# Patient Record
Sex: Female | Born: 1985 | Race: White | Hispanic: No | Marital: Married | Smoking: Never smoker
Health system: Southern US, Community
[De-identification: ages and names within clinical notes are randomized; demographics above are authoritative.]

## PROBLEM LIST (undated history)

## (undated) ENCOUNTER — Inpatient Hospital Stay (HOSPITAL_COMMUNITY): Payer: Self-pay

## (undated) DIAGNOSIS — Z8619 Personal history of other infectious and parasitic diseases: Secondary | ICD-10-CM

## (undated) DIAGNOSIS — T6701XA Heatstroke and sunstroke, initial encounter: Secondary | ICD-10-CM

## (undated) HISTORY — DX: Personal history of other infectious and parasitic diseases: Z86.19

## (undated) HISTORY — PX: WISDOM TOOTH EXTRACTION: SHX21

---
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
## 2005-03-16 ENCOUNTER — Ambulatory Visit: Payer: Self-pay | Admitting: Family Medicine

## 2005-03-29 ENCOUNTER — Ambulatory Visit: Payer: Self-pay | Admitting: Family Medicine

## 2011-01-18 LAB — OB RESULTS CONSOLE GC/CHLAMYDIA: Chlamydia: NEGATIVE

## 2011-01-18 LAB — OB RESULTS CONSOLE RPR: RPR: NONREACTIVE

## 2011-01-18 LAB — OB RESULTS CONSOLE ANTIBODY SCREEN: Antibody Screen: NEGATIVE

## 2011-02-22 NOTE — L&D Delivery Note (Signed)
Operative Delivery Note At 2:19 PM a viable female was delivered via Vaginal, Vacuum Investment banker, operational).  Presentation: vertex; Position: Left,, Occiput,, Transverse; Station: +3.  Verbal consent: obtained from patient.  Risks and benefits discussed in detail.  Risks include, but are not limited to the risks of anesthesia, bleeding, infection, damage to maternal tissues, fetal cephalhematoma.  There is also the risk of inability to effect vaginal delivery of the head, or shoulder dystocia that cannot be resolved by established maneuvers, leading to the need for emergency cesarean section.  APGAR: 8, 9; weight .   Placenta status: Intact, Spontaneous.   Cord: 3 vessels with the following complications: Short.  Cord pH: not done  Anesthesia: Epidural Local  Instruments: Mushroom delivered head easily with 3 pulls and no popoffs Episiotomy: None Lacerations: Sulcus bilateral Suture Repair: 3.0 chromic Est. Blood Loss (mL): 500  Mom to postpartum.  Baby to nursery-stable.  Brynnan Rodenbaugh L 08/28/2011, 2:33 PM

## 2011-07-26 LAB — OB RESULTS CONSOLE GBS: GBS: NEGATIVE

## 2011-08-19 ENCOUNTER — Encounter (HOSPITAL_COMMUNITY): Payer: Self-pay | Admitting: *Deleted

## 2011-08-19 ENCOUNTER — Telehealth (HOSPITAL_COMMUNITY): Payer: Self-pay | Admitting: *Deleted

## 2011-08-19 NOTE — Telephone Encounter (Signed)
Preadmission screen  

## 2011-08-28 ENCOUNTER — Inpatient Hospital Stay (HOSPITAL_COMMUNITY)
Admission: AD | Admit: 2011-08-28 | Discharge: 2011-08-30 | DRG: 373 | Disposition: A | Discharge status: Home / Self Care | Payer: BC Managed Care – PPO | Source: Ambulatory Visit | Attending: Obstetrics and Gynecology | Admitting: Obstetrics and Gynecology

## 2011-08-28 ENCOUNTER — Inpatient Hospital Stay (HOSPITAL_COMMUNITY): Payer: BC Managed Care – PPO | Admitting: Anesthesiology

## 2011-08-28 ENCOUNTER — Encounter (HOSPITAL_COMMUNITY): Payer: Self-pay | Admitting: *Deleted

## 2011-08-28 ENCOUNTER — Inpatient Hospital Stay (HOSPITAL_COMMUNITY): Admission: RE | Admit: 2011-08-28 | Payer: Self-pay | Source: Ambulatory Visit

## 2011-08-28 ENCOUNTER — Encounter (HOSPITAL_COMMUNITY): Payer: Self-pay | Admitting: Anesthesiology

## 2011-08-28 HISTORY — DX: Heatstroke and sunstroke, initial encounter: T67.01XA

## 2011-08-28 LAB — CBC
MCH: 28.2 pg (ref 26.0–34.0)
Platelets: 208 10*3/uL (ref 150–400)
RBC: 4.5 MIL/uL (ref 3.87–5.11)
RDW: 13.4 % (ref 11.5–15.5)

## 2011-08-28 LAB — RPR: RPR Ser Ql: NONREACTIVE

## 2011-08-28 LAB — TYPE AND SCREEN: Antibody Screen: NEGATIVE

## 2011-08-28 LAB — ABO/RH: ABO/RH(D): O POS

## 2011-08-28 MED ORDER — OXYCODONE-ACETAMINOPHEN 5-325 MG PO TABS: 1.0000 | ORAL_TABLET | ORAL | Status: DC | PRN

## 2011-08-28 MED ORDER — DIPHENHYDRAMINE HCL 25 MG PO CAPS: 25.0000 mg | ORAL_CAPSULE | Freq: Four times a day (QID) | ORAL | Status: DC | PRN

## 2011-08-28 MED ORDER — EPHEDRINE 5 MG/ML INJ
10.0000 mg | INTRAVENOUS | Status: DC | PRN
  Filled 2011-08-28: qty 4

## 2011-08-28 MED ORDER — LANOLIN HYDROUS EX OINT: TOPICAL_OINTMENT | CUTANEOUS | Status: DC | PRN

## 2011-08-28 MED ORDER — MEASLES, MUMPS & RUBELLA VAC ~~LOC~~ INJ: 0.5000 mL | INJECTION | Freq: Once | SUBCUTANEOUS | Status: DC

## 2011-08-28 MED ORDER — LIDOCAINE HCL (PF) 1 % IJ SOLN
30.0000 mL | INTRAMUSCULAR | Status: DC | PRN
  Administered 2011-08-28: 30 mL via SUBCUTANEOUS
  Filled 2011-08-28: qty 30

## 2011-08-28 MED ORDER — ACETAMINOPHEN 325 MG PO TABS: 650.0000 mg | ORAL_TABLET | ORAL | Status: DC | PRN

## 2011-08-28 MED ORDER — LIDOCAINE HCL (PF) 1 % IJ SOLN
INTRAMUSCULAR | Status: DC | PRN
  Administered 2011-08-28 (×2): 5 mL

## 2011-08-28 MED ORDER — LACTATED RINGERS IV SOLN
500.0000 mL | Freq: Once | INTRAVENOUS | Status: AC
  Administered 2011-08-28: 1000 mL via INTRAVENOUS

## 2011-08-28 MED ORDER — TETANUS-DIPHTH-ACELL PERTUSSIS 5-2.5-18.5 LF-MCG/0.5 IM SUSP: 0.5000 mL | Freq: Once | INTRAMUSCULAR | Status: DC

## 2011-08-28 MED ORDER — PRENATAL MULTIVITAMIN CH
1.0000 | ORAL_TABLET | Freq: Every day | ORAL | Status: DC
  Administered 2011-08-28 – 2011-08-30 (×3): 1 via ORAL
  Filled 2011-08-28 (×3): qty 1

## 2011-08-28 MED ORDER — CITRIC ACID-SODIUM CITRATE 334-500 MG/5ML PO SOLN: 30.0000 mL | ORAL | Status: DC | PRN

## 2011-08-28 MED ORDER — LACTATED RINGERS IV SOLN
500.0000 mL | INTRAVENOUS | Status: DC | PRN
Start: 2011-08-28 — End: 2011-08-28

## 2011-08-28 MED ORDER — ONDANSETRON HCL 4 MG/2ML IJ SOLN
4.0000 mg | Freq: Four times a day (QID) | INTRAMUSCULAR | Status: DC | PRN
  Administered 2011-08-28: 4 mg via INTRAVENOUS
  Filled 2011-08-28: qty 2

## 2011-08-28 MED ORDER — PHENYLEPHRINE 40 MCG/ML (10ML) SYRINGE FOR IV PUSH (FOR BLOOD PRESSURE SUPPORT): 80.0000 ug | PREFILLED_SYRINGE | INTRAVENOUS | Status: DC | PRN

## 2011-08-28 MED ORDER — DIPHENHYDRAMINE HCL 50 MG/ML IJ SOLN: 12.5000 mg | INTRAMUSCULAR | Status: DC | PRN

## 2011-08-28 MED ORDER — OXYTOCIN 40 UNITS IN LACTATED RINGERS INFUSION - SIMPLE MED: 62.5000 mL/h | Freq: Once | INTRAVENOUS | Status: DC

## 2011-08-28 MED ORDER — EPHEDRINE 5 MG/ML INJ: 10.0000 mg | INTRAVENOUS | Status: DC | PRN

## 2011-08-28 MED ORDER — ONDANSETRON HCL 4 MG/2ML IJ SOLN: 4.0000 mg | INTRAMUSCULAR | Status: DC | PRN

## 2011-08-28 MED ORDER — IBUPROFEN 600 MG PO TABS
600.0000 mg | ORAL_TABLET | Freq: Four times a day (QID) | ORAL | Status: DC
  Administered 2011-08-28 – 2011-08-30 (×8): 600 mg via ORAL
  Filled 2011-08-28 (×7): qty 1

## 2011-08-28 MED ORDER — ONDANSETRON HCL 4 MG PO TABS: 4.0000 mg | ORAL_TABLET | ORAL | Status: DC | PRN

## 2011-08-28 MED ORDER — TERBUTALINE SULFATE 1 MG/ML IJ SOLN: 0.2500 mg | Freq: Once | INTRAMUSCULAR | Status: DC | PRN

## 2011-08-28 MED ORDER — OXYTOCIN BOLUS FROM INFUSION
250.0000 mL | Freq: Once | INTRAVENOUS | Status: DC
  Filled 2011-08-28: qty 500

## 2011-08-28 MED ORDER — FENTANYL 2.5 MCG/ML BUPIVACAINE 1/10 % EPIDURAL INFUSION (WH - ANES)
14.0000 mL/h | INTRAMUSCULAR | Status: DC
  Administered 2011-08-28 (×4): 14 mL/h via EPIDURAL
  Filled 2011-08-28 (×4): qty 60

## 2011-08-28 MED ORDER — FLEET ENEMA 7-19 GM/118ML RE ENEM: 1.0000 | ENEMA | Freq: Every day | RECTAL | Status: DC | PRN

## 2011-08-28 MED ORDER — SIMETHICONE 80 MG PO CHEW: 80.0000 mg | CHEWABLE_TABLET | ORAL | Status: DC | PRN

## 2011-08-28 MED ORDER — WITCH HAZEL-GLYCERIN EX PADS: 1.0000 "application " | MEDICATED_PAD | CUTANEOUS | Status: DC | PRN

## 2011-08-28 MED ORDER — SENNOSIDES-DOCUSATE SODIUM 8.6-50 MG PO TABS
2.0000 | ORAL_TABLET | Freq: Every day | ORAL | Status: DC
  Administered 2011-08-29 (×2): 2 via ORAL

## 2011-08-28 MED ORDER — BISACODYL 10 MG RE SUPP: 10.0000 mg | Freq: Every day | RECTAL | Status: DC | PRN

## 2011-08-28 MED ORDER — OXYTOCIN 40 UNITS IN LACTATED RINGERS INFUSION - SIMPLE MED
1.0000 m[IU]/min | INTRAVENOUS | Status: DC
  Administered 2011-08-28: 2 m[IU]/min via INTRAVENOUS
  Filled 2011-08-28: qty 1000

## 2011-08-28 MED ORDER — LACTATED RINGERS IV SOLN
INTRAVENOUS | Status: DC
  Administered 2011-08-28 (×2): via INTRAVENOUS

## 2011-08-28 MED ORDER — DIBUCAINE 1 % RE OINT: 1.0000 "application " | TOPICAL_OINTMENT | RECTAL | Status: DC | PRN

## 2011-08-28 MED ORDER — FLEET ENEMA 7-19 GM/118ML RE ENEM: 1.0000 | ENEMA | RECTAL | Status: DC | PRN

## 2011-08-28 MED ORDER — ZOLPIDEM TARTRATE 5 MG PO TABS: 5.0000 mg | ORAL_TABLET | Freq: Every evening | ORAL | Status: DC | PRN

## 2011-08-28 MED ORDER — BENZOCAINE-MENTHOL 20-0.5 % EX AERO
1.0000 "application " | INHALATION_SPRAY | CUTANEOUS | Status: DC | PRN
  Administered 2011-08-29: 1 via TOPICAL
  Filled 2011-08-28: qty 56

## 2011-08-28 MED ORDER — PHENYLEPHRINE 40 MCG/ML (10ML) SYRINGE FOR IV PUSH (FOR BLOOD PRESSURE SUPPORT)
80.0000 ug | PREFILLED_SYRINGE | INTRAVENOUS | Status: DC | PRN
  Filled 2011-08-28: qty 5

## 2011-08-28 MED ORDER — IBUPROFEN 600 MG PO TABS: 600.0000 mg | ORAL_TABLET | Freq: Four times a day (QID) | ORAL | Status: DC | PRN

## 2011-08-28 MED ORDER — MEDROXYPROGESTERONE ACETATE 150 MG/ML IM SUSP: 150.0000 mg | INTRAMUSCULAR | Status: DC | PRN

## 2011-08-28 NOTE — H&P (Signed)
26 year old G 1 P 0 at 40 w 4 days presents with SROM this am clear fluid. She was 2 cm dilated on admission. She is now comfortable status post epidural.  PNC see [place] GBBS is negative  Afebrile Vital signs stable  General alert and oriented Lung CTAB Car RRR Abdomen is soft and non tender  Cervix is 90%/5 cm -1 Vertex  IMPRESSION: LABOR SROM  PLAN: Pitocin for augmentation Follow labor curve

## 2011-08-28 NOTE — Progress Notes (Signed)
Pt sleepy but discussing breastfeeding w/nursery RN. Given tea and Sprite to drink. VS stable.

## 2011-08-28 NOTE — Anesthesia Preprocedure Evaluation (Signed)

## 2011-08-28 NOTE — Progress Notes (Signed)
Patient feeling more pressure. Fetal heart rate is reactive Toco Great contraction pattern Cervix is C/C +1 Some caput  IMPRESSION: IUP at term Labor  PLAN: Start pushing

## 2011-08-28 NOTE — Anesthesia Procedure Notes (Signed)
Epidural Patient location during procedure: OB Start time: 08/28/2011 6:41 AM  Staffing Anesthesiologist: Brayton Caves R Performed by: anesthesiologist   Preanesthetic Checklist Completed: patient identified, site marked, surgical consent, pre-op evaluation, timeout performed, IV checked, risks and benefits discussed and monitors and equipment checked  Epidural Patient position: sitting Prep: site prepped and draped and DuraPrep Patient monitoring: continuous pulse ox and blood pressure Approach: midline Injection technique: LOR air and LOR saline  Needle:  Needle type: Tuohy  Needle gauge: 17 G Needle length: 9 cm Needle insertion depth: 6 cm Catheter type: closed end flexible Catheter size: 19 Gauge Catheter at skin depth: 11 cm Test dose: negative  Assessment Events: blood not aspirated, injection not painful, no injection resistance, negative IV test and no paresthesia  Additional Notes Patient identified.  Risk benefits discussed including failed block, incomplete pain control, headache, nerve damage, paralysis, blood pressure changes, nausea, vomiting, reactions to medication both toxic or allergic, and postpartum back pain.  Patient expressed understanding and wished to proceed.  All questions were answered.  Sterile technique used throughout procedure and epidural site dressed with sterile barrier dressing. No paresthesia or other complications noted.The patient did not experience any signs of intravascular injection such as tinnitus or metallic taste in mouth nor signs of intrathecal spread such as rapid motor block. Please see nursing notes for vital signs.

## 2011-08-28 NOTE — MAU Note (Signed)
Pt states, " I was in bed, and I felt a big gush of water  At 1 am,; it was about 2 cups, and if move, I keep felling more run out."

## 2011-08-28 NOTE — Progress Notes (Signed)
At 1620 Pt up in [place], c/o feeling nausea and weakness, became pale and limp. Assisted to bed and pivoted legs to bed. Laid to left side. Evaluating BP. Pt talking, sleepy as she has been all day. Requesting tea to drink.

## 2011-08-29 LAB — CBC
Hemoglobin: 9.8 g/dL — ABNORMAL LOW (ref 12.0–15.0)
MCH: 28.3 pg (ref 26.0–34.0)
RBC: 3.46 MIL/uL — ABNORMAL LOW (ref 3.87–5.11)
WBC: 18 10*3/uL — ABNORMAL HIGH (ref 4.0–10.5)

## 2011-08-29 NOTE — Progress Notes (Signed)
Ur chart review completed.  

## 2011-08-29 NOTE — Progress Notes (Signed)
Post Partum Day one Subjective: no complaints  Objective: Blood pressure 115/77, pulse 90, temperature 98.1 F (36.7 C), temperature source Oral, resp. rate 18, height 5' 6.5" (1.689 m), weight 89.982 kg (198 lb 6 oz), last menstrual period 11/17/2010, SpO2 97.00%, unknown if currently breastfeeding.  Physical Exam:  General: alert Lochia: appropriate Uterine Fundus: firm Incision: na DVT Evaluation: No evidence of DVT seen on physical exam.   Basename 08/29/11 0525 08/28/11 0353  HGB 9.8* 12.7  HCT 29.7* 38.3    Assessment/Plan: Plan for discharge tomorrow   LOS: 1 day   Carollee Nussbaumer S 08/29/2011, 8:17 AM

## 2011-08-29 NOTE — Anesthesia Postprocedure Evaluation (Signed)
  Anesthesia Post-op Note  Patient: Grace Mcdaniel  Procedure(s) Performed: * No surgery found *  Patient Location: Mother/Baby  Anesthesia Type: Epidural  Level of Consciousness: awake  Airway and Oxygen Therapy: Patient Spontanous Breathing  Post-op Pain: none  Post-op Assessment: Patient's Cardiovascular Status Stable, Respiratory Function Stable, Patent Airway, No signs of Nausea or vomiting, Adequate PO intake, Pain level controlled, No headache, No backache, No residual numbness and No residual motor weakness  Post-op Vital Signs: Reviewed and stable  Complications: No apparent anesthesia complications

## 2011-08-30 MED ORDER — IBUPROFEN 600 MG PO TABS: 600.0000 mg | ORAL_TABLET | Freq: Four times a day (QID) | ORAL | Status: AC

## 2011-08-30 NOTE — Discharge Summary (Signed)
Obstetric Discharge Summary Reason for Admission: rupture of membranes Prenatal Procedures: ultrasound Intrapartum Procedures: vacuum Postpartum Procedures: none Complications-Operative and Postpartum: bilateral sulcus tear with repair Hemoglobin  Date Value Range Status  08/29/2011 9.8* 12.0 - 15.0 g/dL Final     REPEATED TO VERIFY     DELTA CHECK NOTED     HCT  Date Value Range Status  08/29/2011 29.7* 36.0 - 46.0 % Final    Physical Exam:  General: alert and cooperative Lochia: appropriate Uterine Fundus: firm Incision: perineum intact DVT Evaluation: No evidence of DVT seen on physical exam.  Discharge Diagnoses: Term Pregnancy-delivered  Discharge Information: Date: 08/30/2011 Activity: pelvic rest Diet: routine Medications: PNV and Ibuprofen Condition: stable Instructions: refer to practice specific booklet Discharge to: home   Newborn Data: Live born female  Birth Weight: 9 lb 7.7 oz (4300 g) APGAR: 8, 9  Home with mother.  Grace Mcdaniel G 08/30/2011, 8:08 AM

## 2011-09-02 ENCOUNTER — Ambulatory Visit (HOSPITAL_COMMUNITY): Payer: BC Managed Care – PPO

## 2013-02-19 LAB — OB RESULTS CONSOLE GC/CHLAMYDIA
Chlamydia: NEGATIVE
GC PROBE AMP, GENITAL: NEGATIVE

## 2013-02-19 LAB — OB RESULTS CONSOLE HIV ANTIBODY (ROUTINE TESTING)
HIV: NONREACTIVE
HIV: NONREACTIVE

## 2013-02-19 LAB — OB RESULTS CONSOLE RPR
RPR: NONREACTIVE
RPR: NONREACTIVE

## 2013-02-19 LAB — OB RESULTS CONSOLE ABO/RH: RH Type: POSITIVE

## 2013-02-19 LAB — OB RESULTS CONSOLE HEPATITIS B SURFACE ANTIGEN: Hepatitis B Surface Ag: NEGATIVE

## 2013-02-19 LAB — OB RESULTS CONSOLE RUBELLA ANTIBODY, IGM: Rubella: IMMUNE

## 2013-02-19 LAB — OB RESULTS CONSOLE ANTIBODY SCREEN: Antibody Screen: NEGATIVE

## 2013-02-21 NOTE — L&D Delivery Note (Signed)
Delivery Note At 8:20 AM a viable female was delivered via Vaginal, Spontaneous Delivery (Presentation:OA  ;  ).  APGAR:9/9 , ; weight .   Placenta status:spont>>intact , .  Cord:  with the following complications: .  Cord pH: not sent  Anesthesia: Epidural  Episiotomy: none Lacerations: first deg Suture Repair: 3.0 vicryl rapide Est. Blood Loss (mL): 300  Mom to postpartum.  Baby to Couplet care / Skin to Skin.  Grace Mcdaniel 09/22/2013, 8:30 AM

## 2013-04-01 ENCOUNTER — Encounter (HOSPITAL_COMMUNITY): Payer: Self-pay | Admitting: *Deleted

## 2013-04-01 ENCOUNTER — Observation Stay (HOSPITAL_COMMUNITY)
Admission: AD | Admit: 2013-04-01 | Discharge: 2013-04-02 | Disposition: A | Discharge status: Home / Self Care | Payer: BC Managed Care – PPO | Source: Ambulatory Visit | Attending: Obstetrics and Gynecology | Admitting: Obstetrics and Gynecology

## 2013-04-01 DIAGNOSIS — O200 Threatened abortion: Principal | ICD-10-CM | POA: Diagnosis present

## 2013-04-01 DIAGNOSIS — R109 Unspecified abdominal pain: Secondary | ICD-10-CM | POA: Insufficient documentation

## 2013-04-01 LAB — CBC
HCT: 35.8 % — ABNORMAL LOW (ref 36.0–46.0)
Hemoglobin: 12.3 g/dL (ref 12.0–15.0)
MCH: 28.4 pg (ref 26.0–34.0)
MCHC: 34.4 g/dL (ref 30.0–36.0)
MCV: 82.7 fL (ref 78.0–100.0)
PLATELETS: 316 10*3/uL (ref 150–400)
RBC: 4.33 MIL/uL (ref 3.87–5.11)
RDW: 13.1 % (ref 11.5–15.5)
WBC: 12.7 10*3/uL — AB (ref 4.0–10.5)

## 2013-04-01 LAB — TYPE AND SCREEN
ABO/RH(D): O POS
ANTIBODY SCREEN: NEGATIVE

## 2013-04-01 MED ORDER — ZOLPIDEM TARTRATE 5 MG PO TABS
5.0000 mg | ORAL_TABLET | Freq: Every evening | ORAL | Status: DC | PRN
  Administered 2013-04-01: 5 mg via ORAL
  Filled 2013-04-01: qty 1

## 2013-04-01 MED ORDER — LACTATED RINGERS IV SOLN
INTRAVENOUS | Status: DC
  Administered 2013-04-01: 75 mL/h via INTRAVENOUS

## 2013-04-01 MED ORDER — DOCUSATE SODIUM 100 MG PO CAPS
100.0000 mg | ORAL_CAPSULE | Freq: Every day | ORAL | Status: DC
Start: 2013-04-01 — End: 2013-04-02
  Administered 2013-04-01: 100 mg via ORAL
  Filled 2013-04-01: qty 1

## 2013-04-01 MED ORDER — CALCIUM CARBONATE ANTACID 500 MG PO CHEW: 2.0000 | CHEWABLE_TABLET | ORAL | Status: DC | PRN

## 2013-04-01 MED ORDER — ACETAMINOPHEN 325 MG PO TABS: 650.0000 mg | ORAL_TABLET | ORAL | Status: DC | PRN

## 2013-04-01 MED ORDER — PRENATAL MULTIVITAMIN CH
1.0000 | ORAL_TABLET | Freq: Every day | ORAL | Status: DC
  Administered 2013-04-01: 1 via ORAL
  Filled 2013-04-01: qty 1

## 2013-04-01 NOTE — H&P (Signed)
Grace Mcdaniel is a 28 y.o. female @ 14+4 wks presenting for heavy VB.  Pt awoke this am and noted bright red VB.  Bleeding a/w mild cramping.  Feels some lightheadedness but a/w not eating breakfast yet.  No n/v.  History OB History   Grav Para Term Preterm Abortions TAB SAB Ect Mult Living   3 1 1  1  1   1      Past Medical History  Diagnosis Date  . H/O varicella   . Heat stroke    Past Surgical History  Procedure Laterality Date  . Wisdom tooth extraction     Family History: family history includes Cancer in her maternal grandmother and mother; Hypertension in her father; Kidney disease in her father. Social History:  reports that she has never smoked. She has never used smokeless tobacco. She reports that she does not drink alcohol or use illicit drugs.   Prenatal Transfer Tool  Maternal Diabetes: No Genetic Screening: Declined Maternal Ultrasounds/Referrals: Normal Fetal Ultrasounds or other Referrals:  None Maternal Substance Abuse:  No Significant Maternal Medications:  None Significant Maternal Lab Results:  None Other Comments:  None  ROS    Blood pressure 109/66, pulse 99, temperature 98.5 F (36.9 C), temperature source Oral, resp. rate 20, height 5\' 8"  (1.727 m), weight 79.833 kg (176 lb), unknown if currently breastfeeding. Exam Physical Exam  Gen - NAD Abd - soft, NT/ND PV - pt with modeate blood noted, cvx closed.    US - 8x2cm clot noted posterior above cervical os.  + FHT.  Anterior placenta  cvx 3.9cm, no funneling Prenatal labs: ABO, Rh:O+   Antibody:   Rubella:   RPR:    HBsAg:    HIV:    GBS:     Assessment/Plan: Threatened Miscarriage - pt uneasy about amount of bleeding she is having Plan obs overnght.  Check CBC and rpt US in am    Grace Mcdaniel 04/01/2013, 3:30 PM

## 2013-04-01 NOTE — Progress Notes (Signed)
Patient's urine in the hat is very bloody and she states she "feels like she is peeing on herself when she stands and it is blood."

## 2013-04-01 NOTE — Progress Notes (Signed)
Pt feeling a bit better.  VB has persisted but decreased somewhat.  No pain.  + FHT by doppler Hgb stable at 12  A/P:  Threatened Ab, 14+ wks Rpt us and cbc in am.   D/c home if bleeding is stable. Plan of care reviewed with pt and husband

## 2013-04-02 ENCOUNTER — Ambulatory Visit (HOSPITAL_COMMUNITY): Payer: BC Managed Care – PPO

## 2013-04-02 LAB — CBC
HEMATOCRIT: 34.6 % — AB (ref 36.0–46.0)
Hemoglobin: 11.5 g/dL — ABNORMAL LOW (ref 12.0–15.0)
MCH: 27.8 pg (ref 26.0–34.0)
MCHC: 33.2 g/dL (ref 30.0–36.0)
MCV: 83.6 fL (ref 78.0–100.0)
Platelets: 294 10*3/uL (ref 150–400)
RBC: 4.14 MIL/uL (ref 3.87–5.11)
RDW: 13.3 % (ref 11.5–15.5)
WBC: 10.3 10*3/uL (ref 4.0–10.5)

## 2013-04-02 NOTE — Progress Notes (Signed)
Pt having small amt of bleeding with penicil eraser size clot on voiding.

## 2013-04-02 NOTE — Progress Notes (Signed)
No complaints this AM.  VB has slowed.  Had same pad on overnight.  No pain since yesterday.    VSS.  AF.  Hgb 12.3->11.5 Blood type O pos  U/S this AM: (report pending) FHT 149.  Clot stable in size.  Gen: A&O x 3 Abd: soft, NT Ext: no c/c/e  27yo G3P1011 at 6879w5d with threatened ab -Bleeding precautions given -F/U scheduled with Dr. Renaldo FiddlerAdkins in 1 week -Discharge home with bedrest until f/u

## 2013-04-02 NOTE — Progress Notes (Signed)
Discharge instructions reviewed with patient. Verbalized an understanding of instructions. Discharged via wheelchair. 

## 2013-04-02 NOTE — Discharge Summary (Signed)
Obstetric Discharge Summary Reason for Admission: observation/evaluation and threatened abortion Prenatal Procedures: ultrasound Intrapartum Procedures: n/a Postpartum Procedures: none Complications-Operative and Postpartum: none Hemoglobin  Date Value Range Status  04/02/2013 11.5* 12.0 - 15.0 g/dL Final     HCT  Date Value Range Status  04/02/2013 34.6* 36.0 - 46.0 % Final    Physical Exam:  General: alert, cooperative and appears stated age 59Lochia: n/a Uterine Fundus: soft Incision: n/a DVT Evaluation: No evidence of DVT seen on physical exam. Negative Homan's sign. No cords or calf tenderness.  Discharge Diagnoses: threatened abortion  Discharge Information: Date: 04/02/2013 Activity: pelvic rest and bed rest Diet: routine Medications: PNV Condition: stable Instructions: refer to practice specific booklet Discharge to: home   Newborn Data: <<This patient has not yet delivered during this pregnancy.>> Home with n/a.  Grace Mcdaniel 04/02/2013, 9:09 AM

## 2013-04-02 NOTE — Discharge Instructions (Signed)
Call MD for T>100.4, severe abdominal pain, heavy vaginal bleeding.  Follow-up in office at appointment in 1 week.  Pelvic rest and bed rest until appointment.

## 2013-08-26 LAB — OB RESULTS CONSOLE GBS: STREP GROUP B AG: NEGATIVE

## 2013-08-31 ENCOUNTER — Inpatient Hospital Stay (HOSPITAL_COMMUNITY)
Admission: AD | Admit: 2013-08-31 | Discharge: 2013-08-31 | Disposition: A | Discharge status: Home / Self Care | Payer: BC Managed Care – PPO | Source: Ambulatory Visit | Attending: Obstetrics and Gynecology | Admitting: Obstetrics and Gynecology

## 2013-08-31 ENCOUNTER — Encounter (HOSPITAL_COMMUNITY): Payer: Self-pay | Admitting: *Deleted

## 2013-08-31 DIAGNOSIS — K644 Residual hemorrhoidal skin tags: Secondary | ICD-10-CM | POA: Insufficient documentation

## 2013-08-31 DIAGNOSIS — K6289 Other specified diseases of anus and rectum: Secondary | ICD-10-CM | POA: Insufficient documentation

## 2013-08-31 DIAGNOSIS — O228X9 Other venous complications in pregnancy, unspecified trimester: Secondary | ICD-10-CM | POA: Insufficient documentation

## 2013-08-31 MED ORDER — LIDOCAINE 5 % EX OINT: 1.0000 "application " | TOPICAL_OINTMENT | CUTANEOUS | Status: DC | PRN

## 2013-08-31 MED ORDER — LIDOCAINE-HYDROCORTISONE ACE 2-2 % RE KIT: 1.0000 "application " | PACK | Freq: Two times a day (BID) | RECTAL | Status: DC

## 2013-08-31 NOTE — MAU Note (Signed)
Assumed care of patient.

## 2013-08-31 NOTE — MAU Provider Note (Signed)
History     CSN: [id number]  Arrival date and time: 08/31/13 1003   First Provider Initiated Contact with Patient 08/31/13 1111      No chief complaint on file.  HPI Ms. Grace Mcdaniel is a 28 y.o. G3P1011 at 5664w2d who presents to MAU today with complaint of rectal pain and pressure with known external hemorrhoids. She states that the pain has been worse since Thursday. She had her last BM this morning and noted scant blood on the tissue and noted tarry stools. She denies abdominal pain, but is having mild braxton-hicks contractions occasionally. She is currently taking Colace, last dose was this morning and using topical preparation H. She denies vaginal bleeding or LOF and reports good fetal movement.   OB History   Grav Para Term Preterm Abortions TAB SAB Ect Mult Living   3 1 1  1  1   1       Past Medical History  Diagnosis Date  . H/O varicella   . Heat stroke     Past Surgical History  Procedure Laterality Date  . Wisdom tooth extraction      Family History  Problem Relation Age of Onset  . Cancer Mother     breast  . Hypertension Father   . Kidney disease Father     stones  . Cancer Maternal Grandmother     breast    History  Substance Use Topics  . Smoking status: Never Smoker   . Smokeless tobacco: Never Used  . Alcohol Use: No    Allergies: No Known Allergies  Prescriptions prior to admission  Medication Sig Dispense Refill  . docusate sodium (COLACE) 100 MG capsule Take 100 mg by mouth as needed for mild constipation.      . Prenatal Vit-Fe Fumarate-FA (PRENATAL MULTIVITAMIN) TABS Take 1 tablet by mouth daily.        Review of Systems  Constitutional: Negative for fever and malaise/fatigue.  Gastrointestinal: Positive for nausea, constipation and melena. Negative for vomiting, abdominal pain, diarrhea and blood in stool.  Genitourinary: Negative for dysuria, urgency and frequency.       Neg - vaginal bleeding, discharge, LOF   Physical  Exam   Blood pressure 123/69, pulse 107, temperature 98.5 F (36.9 C), temperature source Oral, resp. rate 16, height 5\' 8"  (1.727 m), weight 202 lb (91.627 kg), unknown if currently breastfeeding.  Physical Exam  Constitutional: She is oriented to person, place, and time. She appears well-developed and well-nourished. No distress.  HENT:  Head: Normocephalic and atraumatic.  Cardiovascular: Normal rate, regular rhythm and normal heart sounds.   Respiratory: Effort normal and breath sounds normal. No respiratory distress.  GI: Soft. Bowel sounds are normal. She exhibits no distension and no mass. There is no tenderness. There is no rebound and no guarding.  Genitourinary: Rectal exam shows external hemorrhoid (large external hemorrhoids noted without active bleeding) and tenderness.  Neurological: She is alert and oriented to person, place, and time.  Skin: Skin is warm and dry. No erythema.  Psychiatric: She has a normal mood and affect.  Dilation: Closed Effacement (%): Thick Cervical Position: Posterior Station:  (high) Exam by:: Dellie BurnsScarlett Murray, RN BSN  No results found for this or any previous visit (from the past 24 hour(s)).  Fetal Monitoring: Baseline: 120 bpm, moderate variability, + accelerations, no decelerations Contractions: irregular, mild q 2-5 minutes  MAU Course  Procedures None  MDM Discussed with Dr. Renaldo FiddlerAdkins. Rx for Ana-lex and topical lidocaine. Continue  bowel regimen. Follow-up on Monday.   Assessment and Plan  A: External hemorrhoids  P: Discharge home Rx for Ana-lex and topical lidocaine given to patient Patient advised to continue bowel regimen  Patient advised to call the office for a follow-up appointment on Monday for re-assessment of hemorrhoid. May require surgical consult.  Patient may return to MAU as needed or if her condition were to change or worsen  Freddi Starr, PA-C  08/31/2013, 11:21 AM

## 2013-08-31 NOTE — Discharge Instructions (Signed)
Fiber Content in Foods Drinking plenty of fluids and consuming foods high in fiber can help with constipation. See the list below for the fiber content of some common foods. Starches and Grains / Dietary Fiber (g)  Cheerios, 1 cup / 3 g  Kellogg's Corn Flakes, 1 cup / 0.7 g  Rice Krispies, 1  cup / 0.3 g  Quaker Oat Life Cereal,  cup / 2.1 g  Oatmeal, instant (cooked),  cup / 2 g  Kellogg's Frosted Mini Wheats, 1 cup / 5.1 g  Rice, brown, long-grain (cooked), 1 cup / 3.5 g  Rice, white, long-grain (cooked), 1 cup / 0.6 g  Macaroni, cooked, enriched, 1 cup / 2.5 g Legumes / Dietary Fiber (g)  Beans, baked, canned, plain or vegetarian,  cup / 5.2 g  Beans, kidney, canned,  cup / 6.8 g  Beans, pinto, dried (cooked),  cup / 7.7 g  Beans, pinto, canned,  cup / 5.5 g Breads and Crackers / Dietary Fiber (g)  Graham crackers, plain or honey, 2 squares / 0.7 g  Saltine crackers, 3 squares / 0.3 g  Pretzels, plain, salted, 10 pieces / 1.8 g  Bread, whole-wheat, 1 slice / 1.9 g  Bread, white, 1 slice / 0.7 g  Bread, raisin, 1 slice / 1.2 g  Bagel, plain, 3 oz / 2 g  Tortilla, flour, 1 oz / 0.9 g  Tortilla, corn, 1 small / 1.5 g  Bun, hamburger or hotdog, 1 small / 0.9 g Fruits / Dietary Fiber (g)  Apple, raw with skin, 1 medium / 4.4 g  Applesauce, sweetened,  cup / 1.5 g  Banana,  medium / 1.5 g  Grapes, 10 grapes / 0.4 g  Orange, 1 small / 2.3 g  Raisin, 1.5 oz / 1.6 g  Melon, 1 cup / 1.4 g Vegetables / Dietary Fiber (g)  Green beans, canned,  cup / 1.3 g  Carrots (cooked),  cup / 2.3 g  Broccoli (cooked),  cup / 2.8 g  Peas, frozen (cooked),  cup / 4.4 g  Potatoes, mashed,  cup / 1.6 g  Lettuce, 1 cup / 0.5 g  Corn, canned,  cup / 1.6 g  Tomato,  cup / 1.1 g Document Released: 06/26/2006 Document Revised: 05/02/2011 Document Reviewed: 08/21/2006 [employer] Patient Information 2015 [employer]. This information is not  intended to replace advice given to you by your health care provider. Make sure you discuss any questions you have with your health care provider. Hemorrhoids Hemorrhoids are swollen veins around the rectum or anus. There are two types of hemorrhoids:   Internal hemorrhoids. These occur in the veins just inside the rectum. They may poke through to the outside and become irritated and painful.  External hemorrhoids. These occur in the veins outside the anus and can be felt as a painful swelling or hard lump near the anus. CAUSES  Pregnancy.   Obesity.   Constipation or diarrhea.   Straining to have a bowel movement.   Sitting for long periods on the toilet.  Heavy lifting or other activity that caused you to strain.  Anal intercourse. SYMPTOMS   Pain.   Anal itching or irritation.   Rectal bleeding.   Fecal leakage.   Anal swelling.   One or more lumps around the anus.  DIAGNOSIS  Your caregiver may be able to diagnose hemorrhoids by visual examination. Other examinations or tests that may be performed include:   Examination of the rectal area with  a gloved hand (digital rectal exam).   Examination of anal canal using a small tube (scope).   A blood test if you have lost a significant amount of blood.  A test to look inside the colon (sigmoidoscopy or colonoscopy). TREATMENT Most hemorrhoids can be treated at home. However, if symptoms do not seem to be getting better or if you have a lot of rectal bleeding, your caregiver may perform a procedure to help make the hemorrhoids get smaller or remove them completely. Possible treatments include:   Placing a rubber band at the base of the hemorrhoid to cut off the circulation (rubber band ligation).   Injecting a chemical to shrink the hemorrhoid (sclerotherapy).   Using a tool to burn the hemorrhoid (infrared light therapy).   Surgically removing the hemorrhoid (hemorrhoidectomy).   Stapling the  hemorrhoid to block blood flow to the tissue (hemorrhoid stapling).  HOME CARE INSTRUCTIONS   Eat foods with fiber, such as whole grains, beans, nuts, fruits, and vegetables. Ask your doctor about taking products with added fiber in them (fibersupplements).  Increase fluid intake. Drink enough water and fluids to keep your urine clear or pale yellow.   Exercise regularly.   Go to the bathroom when you have the urge to have a bowel movement. Do not wait.   Avoid straining to have bowel movements.   Keep the anal area dry and clean. Use wet toilet paper or moist towelettes after a bowel movement.   Medicated creams and suppositories may be used or applied as directed.   Only take over-the-counter or prescription medicines as directed by your caregiver.   Take warm sitz baths for 15-20 minutes, 3-4 times a day to ease pain and discomfort.   Place ice packs on the hemorrhoids if they are tender and swollen. Using ice packs between sitz baths may be helpful.   Put ice in a plastic bag.   Place a towel between your skin and the bag.   Leave the ice on for 15-20 minutes, 3-4 times a day.   Do not use a donut-shaped pillow or sit on the toilet for long periods. This increases blood pooling and pain.  SEEK MEDICAL CARE IF:  You have increasing pain and swelling that is not controlled by treatment or medicine.  You have uncontrolled bleeding.  You have difficulty or you are unable to have a bowel movement.  You have pain or inflammation outside the area of the hemorrhoids. MAKE SURE YOU:  Understand these instructions.  Will watch your condition.  Will get help right away if you are not doing well or get worse. Document Released: 02/05/2000 Document Revised: 01/25/2012 Document Reviewed: 12/13/2011 [facility] Patient Information 2015 [place], [place]. This information is not intended to replace advice given to you by your health care provider. Make sure you discuss  any questions you have with your health care provider.

## 2013-08-31 NOTE — MAU Note (Signed)
Patient presents with complaint of rectal pressure.

## 2013-09-22 ENCOUNTER — Inpatient Hospital Stay (HOSPITAL_COMMUNITY): Payer: BC Managed Care – PPO | Admitting: Anesthesiology

## 2013-09-22 ENCOUNTER — Inpatient Hospital Stay (HOSPITAL_COMMUNITY)
Admission: AD | Admit: 2013-09-22 | Discharge: 2013-09-23 | DRG: 775 | Disposition: A | Discharge status: Home / Self Care | Payer: BC Managed Care – PPO | Source: Ambulatory Visit | Attending: Obstetrics and Gynecology | Admitting: Obstetrics and Gynecology

## 2013-09-22 ENCOUNTER — Encounter (HOSPITAL_COMMUNITY): Payer: Self-pay

## 2013-09-22 ENCOUNTER — Encounter (HOSPITAL_COMMUNITY): Payer: BC Managed Care – PPO | Admitting: Anesthesiology

## 2013-09-22 DIAGNOSIS — Z8249 Family history of ischemic heart disease and other diseases of the circulatory system: Secondary | ICD-10-CM

## 2013-09-22 LAB — CBC
HEMATOCRIT: 39.4 % (ref 36.0–46.0)
HEMOGLOBIN: 13.1 g/dL (ref 12.0–15.0)
MCH: 28.5 pg (ref 26.0–34.0)
MCHC: 33.2 g/dL (ref 30.0–36.0)
MCV: 85.8 fL (ref 78.0–100.0)
Platelets: 210 10*3/uL (ref 150–400)
RBC: 4.59 MIL/uL (ref 3.87–5.11)
RDW: 14 % (ref 11.5–15.5)
WBC: 11 10*3/uL — ABNORMAL HIGH (ref 4.0–10.5)

## 2013-09-22 LAB — RPR

## 2013-09-22 MED ORDER — LACTATED RINGERS IV SOLN: 500.0000 mL | Freq: Once | INTRAVENOUS | Status: DC

## 2013-09-22 MED ORDER — CITRIC ACID-SODIUM CITRATE 334-500 MG/5ML PO SOLN: 30.0000 mL | ORAL | Status: DC | PRN

## 2013-09-22 MED ORDER — OXYCODONE-ACETAMINOPHEN 5-325 MG PO TABS
1.0000 | ORAL_TABLET | Freq: Four times a day (QID) | ORAL | Status: DC | PRN
  Filled 2013-09-22: qty 1

## 2013-09-22 MED ORDER — IBUPROFEN 800 MG PO TABS
800.0000 mg | ORAL_TABLET | Freq: Three times a day (TID) | ORAL | Status: DC | PRN
  Administered 2013-09-22 – 2013-09-23 (×3): 800 mg via ORAL
  Filled 2013-09-22 (×3): qty 1

## 2013-09-22 MED ORDER — ONDANSETRON HCL 4 MG PO TABS: 4.0000 mg | ORAL_TABLET | ORAL | Status: DC | PRN

## 2013-09-22 MED ORDER — TETANUS-DIPHTH-ACELL PERTUSSIS 5-2.5-18.5 LF-MCG/0.5 IM SUSP: 0.5000 mL | Freq: Once | INTRAMUSCULAR | Status: DC

## 2013-09-22 MED ORDER — SENNOSIDES-DOCUSATE SODIUM 8.6-50 MG PO TABS
2.0000 | ORAL_TABLET | ORAL | Status: DC
  Administered 2013-09-23: 2 via ORAL
  Filled 2013-09-22: qty 2

## 2013-09-22 MED ORDER — LACTATED RINGERS IV SOLN: 500.0000 mL | INTRAVENOUS | Status: DC | PRN

## 2013-09-22 MED ORDER — ONDANSETRON HCL 4 MG/2ML IJ SOLN: 4.0000 mg | INTRAMUSCULAR | Status: DC | PRN

## 2013-09-22 MED ORDER — LACTATED RINGERS IV SOLN
INTRAVENOUS | Status: DC
  Administered 2013-09-22: 04:00:00 via INTRAVENOUS

## 2013-09-22 MED ORDER — PRENATAL MULTIVITAMIN CH
1.0000 | ORAL_TABLET | Freq: Every day | ORAL | Status: DC
  Administered 2013-09-22 – 2013-09-23 (×2): 1 via ORAL
  Filled 2013-09-22 (×2): qty 1

## 2013-09-22 MED ORDER — WITCH HAZEL-GLYCERIN EX PADS: 1.0000 "application " | MEDICATED_PAD | CUTANEOUS | Status: DC | PRN

## 2013-09-22 MED ORDER — OXYTOCIN BOLUS FROM INFUSION
500.0000 mL | INTRAVENOUS | Status: DC
  Administered 2013-09-22: 500 mL via INTRAVENOUS

## 2013-09-22 MED ORDER — BISACODYL 10 MG RE SUPP: 10.0000 mg | Freq: Every day | RECTAL | Status: DC | PRN

## 2013-09-22 MED ORDER — LANOLIN HYDROUS EX OINT: TOPICAL_OINTMENT | CUTANEOUS | Status: DC | PRN

## 2013-09-22 MED ORDER — MEASLES, MUMPS & RUBELLA VAC ~~LOC~~ INJ: 0.5000 mL | INJECTION | Freq: Once | SUBCUTANEOUS | Status: DC

## 2013-09-22 MED ORDER — OXYTOCIN 40 UNITS IN LACTATED RINGERS INFUSION - SIMPLE MED
62.5000 mL/h | INTRAVENOUS | Status: DC
  Administered 2013-09-22: 62.5 mL/h via INTRAVENOUS
  Filled 2013-09-22: qty 1000

## 2013-09-22 MED ORDER — PHENYLEPHRINE 40 MCG/ML (10ML) SYRINGE FOR IV PUSH (FOR BLOOD PRESSURE SUPPORT)
80.0000 ug | PREFILLED_SYRINGE | INTRAVENOUS | Status: DC | PRN
  Filled 2013-09-22: qty 10
  Filled 2013-09-22: qty 2

## 2013-09-22 MED ORDER — SIMETHICONE 80 MG PO CHEW: 80.0000 mg | CHEWABLE_TABLET | ORAL | Status: DC | PRN

## 2013-09-22 MED ORDER — BENZOCAINE-MENTHOL 20-0.5 % EX AERO
1.0000 "application " | INHALATION_SPRAY | CUTANEOUS | Status: DC | PRN
  Administered 2013-09-22: 1 via TOPICAL
  Filled 2013-09-22: qty 56

## 2013-09-22 MED ORDER — EPHEDRINE 5 MG/ML INJ
10.0000 mg | INTRAVENOUS | Status: DC | PRN
  Filled 2013-09-22: qty 2

## 2013-09-22 MED ORDER — FLEET ENEMA 7-19 GM/118ML RE ENEM: 1.0000 | ENEMA | RECTAL | Status: DC | PRN

## 2013-09-22 MED ORDER — DIPHENHYDRAMINE HCL 50 MG/ML IJ SOLN
12.5000 mg | INTRAMUSCULAR | Status: DC | PRN
  Administered 2013-09-22: 12.5 mg via INTRAVENOUS
  Filled 2013-09-22: qty 1

## 2013-09-22 MED ORDER — FLEET ENEMA 7-19 GM/118ML RE ENEM: 1.0000 | ENEMA | Freq: Every day | RECTAL | Status: DC | PRN

## 2013-09-22 MED ORDER — PHENYLEPHRINE 40 MCG/ML (10ML) SYRINGE FOR IV PUSH (FOR BLOOD PRESSURE SUPPORT)
80.0000 ug | PREFILLED_SYRINGE | INTRAVENOUS | Status: DC | PRN
  Filled 2013-09-22: qty 2

## 2013-09-22 MED ORDER — OXYCODONE-ACETAMINOPHEN 5-325 MG PO TABS: 1.0000 | ORAL_TABLET | ORAL | Status: DC | PRN

## 2013-09-22 MED ORDER — ZOLPIDEM TARTRATE 5 MG PO TABS: 5.0000 mg | ORAL_TABLET | Freq: Every evening | ORAL | Status: DC | PRN

## 2013-09-22 MED ORDER — ACETAMINOPHEN 325 MG PO TABS: 650.0000 mg | ORAL_TABLET | ORAL | Status: DC | PRN

## 2013-09-22 MED ORDER — IBUPROFEN 600 MG PO TABS
600.0000 mg | ORAL_TABLET | Freq: Four times a day (QID) | ORAL | Status: DC | PRN
Start: 2013-09-22 — End: 2013-09-22

## 2013-09-22 MED ORDER — DIPHENHYDRAMINE HCL 25 MG PO CAPS: 25.0000 mg | ORAL_CAPSULE | Freq: Four times a day (QID) | ORAL | Status: DC | PRN

## 2013-09-22 MED ORDER — ONDANSETRON HCL 4 MG/2ML IJ SOLN: 4.0000 mg | Freq: Four times a day (QID) | INTRAMUSCULAR | Status: DC | PRN

## 2013-09-22 MED ORDER — LIDOCAINE HCL (PF) 1 % IJ SOLN
INTRAMUSCULAR | Status: AC
  Filled 2013-09-22: qty 30

## 2013-09-22 MED ORDER — LIDOCAINE HCL (PF) 1 % IJ SOLN
30.0000 mL | INTRAMUSCULAR | Status: AC | PRN
  Administered 2013-09-22 (×2): 5 mL via SUBCUTANEOUS

## 2013-09-22 MED ORDER — DIBUCAINE 1 % RE OINT: 1.0000 "application " | TOPICAL_OINTMENT | RECTAL | Status: DC | PRN

## 2013-09-22 MED ORDER — FENTANYL 2.5 MCG/ML BUPIVACAINE 1/10 % EPIDURAL INFUSION (WH - ANES)
14.0000 mL/h | INTRAMUSCULAR | Status: DC | PRN
  Administered 2013-09-22 (×2): 14 mL/h via EPIDURAL
  Filled 2013-09-22: qty 125

## 2013-09-22 NOTE — Anesthesia Postprocedure Evaluation (Signed)
  Anesthesia Post-op Note  Patient: Grace FramesHillary D Severino  Procedure(s) Performed: * No procedures listed *  Patient Location: Mother/Baby  Anesthesia Type:Epidural  Level of Consciousness: awake and alert   Airway and Oxygen Therapy: Patient Spontanous Breathing  Post-op Pain: mild  Post-op Assessment: Post-op Vital signs reviewed, No signs of Nausea or vomiting, Pain level controlled, No headache, No residual numbness and No residual motor weakness  Post-op Vital Signs: Reviewed  Last Vitals:  Filed Vitals:   09/22/13 1155  BP: 121/71  Pulse: 81  Temp: 36.9 C  Resp: 16    Complications: No apparent anesthesia complications

## 2013-09-22 NOTE — Progress Notes (Signed)
Now 9/C/floating vtx?OP>>>pudendal set used to leak ROM>>>clear, will recheck for descent

## 2013-09-22 NOTE — H&P (Signed)
Grace Mcdaniel Grace Mcdaniel is a 28 y.o. female presenting for labor. Maternal Medical History:  Reason for admission: Contractions.   Contractions: Onset was 3-5 hours ago.   Frequency: regular.   Perceived severity is moderate.    Fetal activity: Perceived fetal activity is normal.   Last perceived fetal movement was within the past hour.      OB History   Grav Para Term Preterm Abortions TAB SAB Ect Mult Living   3 1 1  1  1   1      Past Medical History  Diagnosis Date  . H/O varicella   . Heat stroke    Past Surgical History  Procedure Laterality Date  . Wisdom tooth extraction     Family History: family history includes Cancer in her maternal grandmother and mother; Hypertension in her father; Kidney disease in her father. Social History:  reports that she has never smoked. She has never used smokeless tobacco. She reports that she does not drink alcohol or use illicit drugs.   Prenatal Transfer Tool  Maternal Diabetes: No Genetic Screening: Normal Maternal Ultrasounds/Referrals: Normal Fetal Ultrasounds or other Referrals:  None Maternal Substance Abuse:  No Significant Maternal Medications:  None Significant Maternal Lab Results:  None Other Comments:  None  ROS  Dilation: 6 Effacement (%): 90 Station: -2 Exam by:: Benji StanleyRN Blood pressure 134/93, pulse 84, temperature 97.5 F (36.4 C), temperature source Oral, resp. rate 18, height 5\' 8"  (1.727 Mcdaniel), weight 208 lb (94.348 kg), unknown if currently breastfeeding. Maternal Exam:  Uterine Assessment: Contraction strength is moderate.  Contraction frequency is regular.   Abdomen: Patient reports no abdominal tenderness. Fundal height is term FH/FHR 148.   Estimated fetal weight is AGA.   Fetal presentation: vertex  Introitus: Normal vulva. Normal vagina.  Pelvis: adequate for delivery.   Cervix: Cervix evaluated by digital exam.     Physical Exam  Constitutional: She is oriented to person, place, and time.  She appears well-developed and well-nourished.  HENT:  Head: Normocephalic and atraumatic.  Neck: Normal range of motion. Neck supple.  Cardiovascular: Normal rate and regular rhythm.   Respiratory: Effort normal and breath sounds normal.  GI:  Term FH  Genitourinary:  6/90/vtx  Musculoskeletal: Normal range of motion.  Neurological: She is alert and oriented to person, place, and time.    Prenatal labs: ABO, Rh: --/--/O POS (02/09 1651) Antibody: NEG (02/09 1651) Rubella: Immune (12/30 0000) RPR: Nonreactive, Nonreactive (12/30 0000)  HBsAg: Negative (12/30 0000)  HIV: Non-reactive, Non-reactive (12/30 0000)  GBS: Negative (07/06 0000)   Assessment/Plan: Term IUP/labor   Meriel PicaHOLLAND,Grace Mcdaniel 09/22/2013, 1:37 AM

## 2013-09-22 NOTE — Anesthesia Preprocedure Evaluation (Signed)
Anesthesia Evaluation  Patient identified by MRN, date of birth, ID band Patient awake    Reviewed: Allergy & Precautions, H&P , NPO status , Patient's Chart, lab work & pertinent test results  History of Anesthesia Complications Negative for: history of anesthetic complications  Airway Mallampati: II TM Distance: >3 FB Neck ROM: Full    Dental  (+) Teeth Intact   Pulmonary neg pulmonary ROS,          Cardiovascular negative cardio ROS  Rhythm:Regular     Neuro/Psych negative neurological ROS  negative psych ROS   GI/Hepatic negative GI ROS, Neg liver ROS,   Endo/Other  negative endocrine ROS  Renal/GU negative Renal ROS     Musculoskeletal negative musculoskeletal ROS (+)   Abdominal   Peds  Hematology negative hematology ROS (+)   Anesthesia Other Findings   Reproductive/Obstetrics                           Anesthesia Physical Anesthesia Plan  ASA: II  Anesthesia Plan: Epidural   Post-op Pain Management:    Induction:   Airway Management Planned:   Additional Equipment:   Intra-op Plan:   Post-operative Plan:   Informed Consent: I have reviewed the patients History and Physical, chart, labs and discussed the procedure including the risks, benefits and alternatives for the proposed anesthesia with the patient or authorized representative who has indicated his/her understanding and acceptance.   Dental advisory given  Plan Discussed with: Anesthesiologist  Anesthesia Plan Comments:         Anesthesia Quick Evaluation

## 2013-09-22 NOTE — MAU Note (Signed)
Contractions 4 min apart.  3 cm in office. Baby is moving well. Clear mucus d/c- no leaking.  No bleeding.

## 2013-09-22 NOTE — Anesthesia Procedure Notes (Signed)
Epidural Patient location during procedure: OB Start time: 09/22/2013 2:21 AM End time: 09/22/2013 2:40 AM  Staffing Anesthesiologist: Tyniah Kastens, CHRIS Performed by: anesthesiologist   Preanesthetic Checklist Completed: patient identified, surgical consent, pre-op evaluation, timeout performed, IV checked, risks and benefits discussed and monitors and equipment checked  Epidural Patient position: sitting Prep: site prepped and draped and DuraPrep Patient monitoring: heart rate, cardiac monitor, continuous pulse ox and blood pressure Approach: midline Location: L3-L4 Injection technique: LOR saline  Needle:  Needle type: Tuohy  Needle gauge: 17 G Needle length: 9 cm Needle insertion depth: 5 cm Catheter type: closed end flexible Catheter size: 19 Gauge Catheter at skin depth: 12 cm Test dose: Other  Assessment Events: blood not aspirated, injection not painful, no injection resistance, negative IV test and no paresthesia  Additional Notes H+P and labs checked, risks and benefits discussed with the patient, consent obtained, procedure tolerated well and without complications.  Reason for block:procedure for pain

## 2013-09-23 LAB — CBC
HCT: 36.7 % (ref 36.0–46.0)
HEMOGLOBIN: 12.1 g/dL (ref 12.0–15.0)
MCH: 28.4 pg (ref 26.0–34.0)
MCHC: 33 g/dL (ref 30.0–36.0)
MCV: 86.2 fL (ref 78.0–100.0)
Platelets: 180 10*3/uL (ref 150–400)
RBC: 4.26 MIL/uL (ref 3.87–5.11)
RDW: 14 % (ref 11.5–15.5)
WBC: 12 10*3/uL — AB (ref 4.0–10.5)

## 2013-09-23 MED ORDER — IBUPROFEN 800 MG PO TABS
800.0000 mg | ORAL_TABLET | Freq: Three times a day (TID) | ORAL | Status: AC | PRN
Start: 2013-09-23 — End: ?

## 2013-09-23 NOTE — Progress Notes (Signed)
Patient counseled for circ regarding risk of bleeding, infection, scarring. All questions were answered and patient wishes to proceed.   Mitchel HonourMegan Alysha Doolan, DO

## 2013-09-23 NOTE — Discharge Summary (Signed)
Obstetric Discharge Summary Reason for Admission: onset of labor Prenatal Procedures: ultrasound Intrapartum Procedures: spontaneous vaginal delivery Postpartum Procedures: none Complications-Operative and Postpartum: 1 degree perineal laceration Hemoglobin  Date Value Ref Range Status  09/23/2013 12.1  12.0 - 15.0 g/dL Final     HCT  Date Value Ref Range Status  09/23/2013 36.7  36.0 - 46.0 % Final    Physical Exam:  General: alert and cooperative Lochia: appropriate Uterine Fundus: firm Incision: perineum intact DVT Evaluation: No evidence of DVT seen on physical exam. Negative Homan's sign. No cords or calf tenderness. No significant calf/ankle edema.  Discharge Diagnoses: Term Pregnancy-delivered  Discharge Information: Date: 09/23/2013 Activity: pelvic rest Diet: routine Medications: PNV and Ibuprofen Condition: stable Instructions: refer to practice specific booklet Discharge to: home   Newborn Data: Live born female  Birth Weight: 10 lb (4535 g) APGAR: 9, 9  Home with mother and circ prior to discharge.  CURTIS,CAROL G 09/23/2013, 8:47 AM

## 2013-09-23 NOTE — Lactation Note (Signed)
This note was copied from the chart of [place]. Lactation Consultation Note Initial consultation, baby 82 hours old, baby out of room for circ. Mom states breastfeeding is going ok, is using nipple shield at her preference, as she had used one with her older child. Mom concerned that her large baby isn't getting enough milk and that she does not have milk flow yet. Discussed supply and demand, the normal small size of her baby's stomach, and assisted mom to hand express both breasts, mom pleased to see colostrum.  Pump kit and extra nipple shield provided for mom at her request. She has a Medela pump at home and will use the tubing when she pumps at home.  Reviewed Baby & Me book's Breastfeeding Basics, answered questions. Encouraged mom to call the lactation office if she has any concerns, and to attend the BFSG.  Patient Name: Grace Mcdaniel [id number] Date: 09/23/2013 Reason for consult: Initial assessment   Maternal Data Formula Feeding for Exclusion: No Has patient been taught Hand Expression?: Yes Does the patient have breastfeeding experience prior to this delivery?: Yes  Feeding Feeding Type: Breast Fed Length of feed: 10 min  LATCH Score/Interventions Latch: Repeated attempts needed to sustain latch, nipple held in mouth throughout feeding, stimulation needed to elicit sucking reflex. Intervention(s): Adjust position;Assist with latch;Breast massage;Breast compression  Audible Swallowing: A few with stimulation  Type of Nipple: Everted at rest and after stimulation  Comfort (Breast/Nipple): Soft / non-tender (using nipple shield)     Hold (Positioning): No assistance needed to correctly position infant at breast. Intervention(s): Breastfeeding basics reviewed;Support Pillows;Position options;Skin to skin  LATCH Score: 8  Lactation Tools Discussed/Used     Consult Status Consult Status: Complete    Dorise Bullion 09/23/2013, 10:44  AM

## 2013-09-26 ENCOUNTER — Inpatient Hospital Stay (HOSPITAL_COMMUNITY): Payer: BC Managed Care – PPO

## 2013-12-23 ENCOUNTER — Encounter (HOSPITAL_COMMUNITY): Payer: Self-pay

## 2015-04-08 ENCOUNTER — Other Ambulatory Visit: Payer: Self-pay | Admitting: Obstetrics and Gynecology

## 2015-04-08 DIAGNOSIS — R928 Other abnormal and inconclusive findings on diagnostic imaging of breast: Secondary | ICD-10-CM

## 2015-04-24 ENCOUNTER — Ambulatory Visit
Admission: RE | Admit: 2015-04-24 | Discharge: 2015-04-24 | Disposition: A | Discharge status: Home / Self Care | Payer: BC Managed Care – PPO | Source: Ambulatory Visit | Attending: Obstetrics and Gynecology | Admitting: Obstetrics and Gynecology

## 2015-04-24 DIAGNOSIS — R928 Other abnormal and inconclusive findings on diagnostic imaging of breast: Secondary | ICD-10-CM

## 2016-12-16 LAB — OB RESULTS CONSOLE ABO/RH: RH TYPE: POSITIVE

## 2016-12-16 LAB — OB RESULTS CONSOLE GC/CHLAMYDIA
Chlamydia: NEGATIVE
GC PROBE AMP, GENITAL: NEGATIVE

## 2016-12-16 LAB — OB RESULTS CONSOLE HEPATITIS B SURFACE ANTIGEN: Hepatitis B Surface Ag: NEGATIVE

## 2016-12-16 LAB — OB RESULTS CONSOLE HIV ANTIBODY (ROUTINE TESTING): HIV: NONREACTIVE

## 2016-12-16 LAB — OB RESULTS CONSOLE RPR: RPR: NONREACTIVE

## 2016-12-16 LAB — OB RESULTS CONSOLE RUBELLA ANTIBODY, IGM: RUBELLA: IMMUNE

## 2016-12-16 LAB — OB RESULTS CONSOLE ANTIBODY SCREEN: Antibody Screen: NEGATIVE

## 2017-02-21 NOTE — L&D Delivery Note (Signed)
Delivery Note  SVD Viable female Apgars 9,9 over 1st deg ML lac.  Placenta delivered spontaneously intact with 3VC. Repair with 3-0 Chromic with good support and hemostasis noted.  R/V exam confirms.  PH art was sent.   Mother and baby to couplet care and are doing well.  EBL 150cc  Candice Camp, MD

## 2017-06-21 ENCOUNTER — Inpatient Hospital Stay (HOSPITAL_COMMUNITY)
Admission: AD | Admit: 2017-06-21 | Payer: BC Managed Care – PPO | Source: Ambulatory Visit | Admitting: Obstetrics and Gynecology

## 2017-07-06 ENCOUNTER — Telehealth (HOSPITAL_COMMUNITY): Payer: Self-pay | Admitting: *Deleted

## 2017-07-06 ENCOUNTER — Encounter (HOSPITAL_COMMUNITY): Payer: Self-pay | Admitting: *Deleted

## 2017-07-06 NOTE — Telephone Encounter (Signed)
Preadmission screen  

## 2017-07-07 ENCOUNTER — Encounter (HOSPITAL_COMMUNITY): Payer: Self-pay | Admitting: *Deleted

## 2017-07-19 ENCOUNTER — Inpatient Hospital Stay (HOSPITAL_COMMUNITY): Payer: BC Managed Care – PPO | Admitting: Anesthesiology

## 2017-07-19 ENCOUNTER — Encounter (HOSPITAL_COMMUNITY): Payer: Self-pay

## 2017-07-19 ENCOUNTER — Inpatient Hospital Stay (HOSPITAL_COMMUNITY)
Admission: RE | Admit: 2017-07-19 | Discharge: 2017-07-21 | DRG: 807 | Disposition: A | Discharge status: Home / Self Care | Payer: BC Managed Care – PPO | Attending: Obstetrics and Gynecology | Admitting: Obstetrics and Gynecology

## 2017-07-19 ENCOUNTER — Other Ambulatory Visit: Payer: Self-pay

## 2017-07-19 DIAGNOSIS — Z3A39 39 weeks gestation of pregnancy: Secondary | ICD-10-CM

## 2017-07-19 DIAGNOSIS — O3663X0 Maternal care for excessive fetal growth, third trimester, not applicable or unspecified: Principal | ICD-10-CM | POA: Diagnosis present

## 2017-07-19 DIAGNOSIS — Z3490 Encounter for supervision of normal pregnancy, unspecified, unspecified trimester: Secondary | ICD-10-CM | POA: Diagnosis present

## 2017-07-19 LAB — CBC
HEMATOCRIT: 37.4 % (ref 36.0–46.0)
HEMOGLOBIN: 12.2 g/dL (ref 12.0–15.0)
MCH: 28.4 pg (ref 26.0–34.0)
MCHC: 32.6 g/dL (ref 30.0–36.0)
MCV: 87.2 fL (ref 78.0–100.0)
Platelets: 245 10*3/uL (ref 150–400)
RBC: 4.29 MIL/uL (ref 3.87–5.11)
RDW: 13.6 % (ref 11.5–15.5)
WBC: 8.2 10*3/uL (ref 4.0–10.5)

## 2017-07-19 LAB — OB RESULTS CONSOLE GBS: GBS: NEGATIVE

## 2017-07-19 LAB — RPR: RPR: NONREACTIVE

## 2017-07-19 LAB — TYPE AND SCREEN
ABO/RH(D): O POS
ANTIBODY SCREEN: NEGATIVE

## 2017-07-19 MED ORDER — COCONUT OIL OIL
1.0000 "application " | TOPICAL_OIL | Status: DC | PRN
  Administered 2017-07-21: 1 via TOPICAL
  Filled 2017-07-19: qty 120

## 2017-07-19 MED ORDER — BUTORPHANOL TARTRATE 1 MG/ML IJ SOLN: 1.0000 mg | INTRAMUSCULAR | Status: DC | PRN

## 2017-07-19 MED ORDER — ONDANSETRON HCL 4 MG/2ML IJ SOLN
4.0000 mg | Freq: Four times a day (QID) | INTRAMUSCULAR | Status: DC | PRN
  Administered 2017-07-19: 4 mg via INTRAVENOUS
  Filled 2017-07-19: qty 2

## 2017-07-19 MED ORDER — ACETAMINOPHEN 325 MG PO TABS: 650.0000 mg | ORAL_TABLET | ORAL | Status: DC | PRN

## 2017-07-19 MED ORDER — SOD CITRATE-CITRIC ACID 500-334 MG/5ML PO SOLN: 30.0000 mL | ORAL | Status: DC | PRN

## 2017-07-19 MED ORDER — OXYCODONE-ACETAMINOPHEN 5-325 MG PO TABS: 2.0000 | ORAL_TABLET | ORAL | Status: DC | PRN

## 2017-07-19 MED ORDER — TERBUTALINE SULFATE 1 MG/ML IJ SOLN
0.2500 mg | Freq: Once | INTRAMUSCULAR | Status: DC | PRN
  Filled 2017-07-19: qty 1

## 2017-07-19 MED ORDER — LACTATED RINGERS IV SOLN
INTRAVENOUS | Status: DC
  Administered 2017-07-19 (×2): via INTRAVENOUS

## 2017-07-19 MED ORDER — FENTANYL 2.5 MCG/ML BUPIVACAINE 1/10 % EPIDURAL INFUSION (WH - ANES)
14.0000 mL/h | INTRAMUSCULAR | Status: DC | PRN
  Administered 2017-07-19: 14 mL/h via EPIDURAL
  Filled 2017-07-19: qty 100

## 2017-07-19 MED ORDER — PRENATAL MULTIVITAMIN CH
1.0000 | ORAL_TABLET | Freq: Every day | ORAL | Status: DC
  Administered 2017-07-20 – 2017-07-21 (×2): 1 via ORAL
  Filled 2017-07-19 (×2): qty 1

## 2017-07-19 MED ORDER — PHENYLEPHRINE 40 MCG/ML (10ML) SYRINGE FOR IV PUSH (FOR BLOOD PRESSURE SUPPORT)
80.0000 ug | PREFILLED_SYRINGE | INTRAVENOUS | Status: DC | PRN
  Filled 2017-07-19: qty 10
  Filled 2017-07-19: qty 5

## 2017-07-19 MED ORDER — OXYCODONE-ACETAMINOPHEN 5-325 MG PO TABS: 1.0000 | ORAL_TABLET | ORAL | Status: DC | PRN

## 2017-07-19 MED ORDER — OXYTOCIN 40 UNITS IN LACTATED RINGERS INFUSION - SIMPLE MED
2.5000 [IU]/h | INTRAVENOUS | Status: DC
  Administered 2017-07-19: 2.5 [IU]/h via INTRAVENOUS

## 2017-07-19 MED ORDER — EPHEDRINE 5 MG/ML INJ
10.0000 mg | INTRAVENOUS | Status: DC | PRN
  Filled 2017-07-19: qty 2

## 2017-07-19 MED ORDER — DIPHENHYDRAMINE HCL 50 MG/ML IJ SOLN: 12.5000 mg | INTRAMUSCULAR | Status: DC | PRN

## 2017-07-19 MED ORDER — MEDROXYPROGESTERONE ACETATE 150 MG/ML IM SUSP: 150.0000 mg | INTRAMUSCULAR | Status: DC | PRN

## 2017-07-19 MED ORDER — MEASLES, MUMPS & RUBELLA VAC ~~LOC~~ INJ: 0.5000 mL | INJECTION | Freq: Once | SUBCUTANEOUS | Status: DC

## 2017-07-19 MED ORDER — LIDOCAINE HCL (PF) 1 % IJ SOLN
INTRAMUSCULAR | Status: DC | PRN
  Administered 2017-07-19 (×2): 5 mL via EPIDURAL

## 2017-07-19 MED ORDER — DIPHENHYDRAMINE HCL 25 MG PO CAPS
25.0000 mg | ORAL_CAPSULE | Freq: Four times a day (QID) | ORAL | Status: DC | PRN
Start: 2017-07-19 — End: 2017-07-21

## 2017-07-19 MED ORDER — LACTATED RINGERS IV SOLN
500.0000 mL | Freq: Once | INTRAVENOUS | Status: AC
  Administered 2017-07-19: 500 mL via INTRAVENOUS

## 2017-07-19 MED ORDER — SIMETHICONE 80 MG PO CHEW
80.0000 mg | CHEWABLE_TABLET | ORAL | Status: DC | PRN
Start: 2017-07-19 — End: 2017-07-21

## 2017-07-19 MED ORDER — DIBUCAINE 1 % RE OINT: 1.0000 "application " | TOPICAL_OINTMENT | RECTAL | Status: DC | PRN

## 2017-07-19 MED ORDER — PHENYLEPHRINE 40 MCG/ML (10ML) SYRINGE FOR IV PUSH (FOR BLOOD PRESSURE SUPPORT)
80.0000 ug | PREFILLED_SYRINGE | INTRAVENOUS | Status: DC | PRN
  Filled 2017-07-19: qty 5

## 2017-07-19 MED ORDER — SENNOSIDES-DOCUSATE SODIUM 8.6-50 MG PO TABS
2.0000 | ORAL_TABLET | ORAL | Status: DC
  Administered 2017-07-19 – 2017-07-21 (×2): 2 via ORAL
  Filled 2017-07-19 (×2): qty 2

## 2017-07-19 MED ORDER — LIDOCAINE HCL (PF) 1 % IJ SOLN
30.0000 mL | INTRAMUSCULAR | Status: DC | PRN
  Filled 2017-07-19: qty 30

## 2017-07-19 MED ORDER — ONDANSETRON HCL 4 MG/2ML IJ SOLN: 4.0000 mg | INTRAMUSCULAR | Status: DC | PRN

## 2017-07-19 MED ORDER — ZOLPIDEM TARTRATE 5 MG PO TABS: 5.0000 mg | ORAL_TABLET | Freq: Every evening | ORAL | Status: DC | PRN

## 2017-07-19 MED ORDER — WITCH HAZEL-GLYCERIN EX PADS: 1.0000 "application " | MEDICATED_PAD | CUTANEOUS | Status: DC | PRN

## 2017-07-19 MED ORDER — BENZOCAINE-MENTHOL 20-0.5 % EX AERO
1.0000 "application " | INHALATION_SPRAY | CUTANEOUS | Status: DC | PRN
  Administered 2017-07-19: 1 via TOPICAL
  Filled 2017-07-19: qty 56

## 2017-07-19 MED ORDER — TETANUS-DIPHTH-ACELL PERTUSSIS 5-2.5-18.5 LF-MCG/0.5 IM SUSP: 0.5000 mL | Freq: Once | INTRAMUSCULAR | Status: DC

## 2017-07-19 MED ORDER — OXYTOCIN BOLUS FROM INFUSION
500.0000 mL | Freq: Once | INTRAVENOUS | Status: AC
  Administered 2017-07-19: 500 mL via INTRAVENOUS

## 2017-07-19 MED ORDER — IBUPROFEN 600 MG PO TABS
600.0000 mg | ORAL_TABLET | Freq: Four times a day (QID) | ORAL | Status: DC
  Administered 2017-07-19 – 2017-07-21 (×8): 600 mg via ORAL
  Filled 2017-07-19 (×8): qty 1

## 2017-07-19 MED ORDER — LACTATED RINGERS IV SOLN: 500.0000 mL | INTRAVENOUS | Status: DC | PRN

## 2017-07-19 MED ORDER — OXYTOCIN 40 UNITS IN LACTATED RINGERS INFUSION - SIMPLE MED
1.0000 m[IU]/min | INTRAVENOUS | Status: DC
  Administered 2017-07-19: 4 m[IU]/min via INTRAVENOUS
  Administered 2017-07-19: 2 m[IU]/min via INTRAVENOUS
  Filled 2017-07-19: qty 1000

## 2017-07-19 MED ORDER — LACTATED RINGERS IV SOLN: 500.0000 mL | Freq: Once | INTRAVENOUS | Status: DC

## 2017-07-19 MED ORDER — ONDANSETRON HCL 4 MG PO TABS: 4.0000 mg | ORAL_TABLET | ORAL | Status: DC | PRN

## 2017-07-19 NOTE — H&P (Signed)
Grace Mcdaniel is a 32 y.o. female presenting for IOL due to LGA and hx of SVD 10#.  Normal glucola.  GBS-. OB History    Gravida  4   Para  2   Term  2   Preterm      AB  1   Living  2     SAB  1   TAB      Ectopic      Multiple      Live Births  2          Past Medical History:  Diagnosis Date  . H/O varicella   . Heat stroke    Past Surgical History:  Procedure Laterality Date  . WISDOM TOOTH EXTRACTION     Family History: family history includes Cancer in her maternal grandmother and mother; Hypertension in her father; Kidney disease in her father. Social History:  reports that she has never smoked. She has never used smokeless tobacco. She reports that she does not drink alcohol or use drugs.     Maternal Diabetes: No Genetic Screening: Normal Maternal Ultrasounds/Referrals: Normal Fetal Ultrasounds or other Referrals:  None Maternal Substance Abuse:  No Significant Maternal Medications:  None Significant Maternal Lab Results:  None Other Comments:  None  ROS History   Blood pressure (!) 144/93, pulse 95, temperature 98.1 F (36.7 Mcdaniel), resp. rate 18, height  (1.727 m), weight 215 lb (97.5 kg), last menstrual period 10/17/2016, unknown if currently breastfeeding. Exam Physical Exam  Prenatal labs: ABO, Rh: O/Positive/-- (10/26 0000) Antibody: Negative (10/26 0000) Rubella: Immune (10/26 0000) RPR: Nonreactive (10/26 0000)  HBsAg: Negative (10/26 0000)  HIV: Non-reactive (10/26 0000)  GBS:     Assessment/Plan: IUP at term LGA and hx of SVD 10# Plan Pitocin and AROM and anticipate SVD Discussed Risks assoc with shoulder dystocia and she wants to proceed with IOL   Grace Mcdaniel 07/19/2017, 8:18 AM

## 2017-07-19 NOTE — Anesthesia Pain Management Evaluation Note (Signed)
  CRNA Pain Management Visit Note  Patient: Grace Mcdaniel, 32 y.o., female  "Hello I am a member of the anesthesia team at [facility]. We have an anesthesia team available at all times to provide care throughout the hospital, including epidural management and anesthesia for C-section. I don't know your plan for the delivery whether it a natural birth, water birth, IV sedation, nitrous supplementation, doula or epidural, but we want to meet your pain goals."   1.Was your pain managed to your expectations on prior hospitalizations?   Yes   2.What is your expectation for pain management during this hospitalization?     Epidural  3.How can we help you reach that goal?   Record the patient's initial score and the patient's pain goal.   Pain: 0  Pain Goal: 5 [facility] wants you to be able to say your pain was always managed very well.  Laban Emperor 07/19/2017

## 2017-07-19 NOTE — Anesthesia Preprocedure Evaluation (Signed)
Anesthesia Evaluation  Patient identified by MRN, date of birth, ID band Patient awake    Reviewed: Allergy & Precautions, H&P , NPO status , Patient's Chart, lab work & pertinent test results  Airway Mallampati: II   Neck ROM: full    Dental   Pulmonary neg pulmonary ROS,    breath sounds clear to auscultation       Cardiovascular negative cardio ROS   Rhythm:regular Rate:Normal     Neuro/Psych    GI/Hepatic   Endo/Other    Renal/GU      Musculoskeletal   Abdominal   Peds  Hematology   Anesthesia Other Findings   Reproductive/Obstetrics (+) Pregnancy                             Anesthesia Physical Anesthesia Plan  ASA: I  Anesthesia Plan: Epidural   Post-op Pain Management:    Induction: Intravenous  PONV Risk Score and Plan: 2 and Treatment may vary due to age or medical condition  Airway Management Planned: Natural Airway  Additional Equipment:   Intra-op Plan:   Post-operative Plan:   Informed Consent: I have reviewed the patients History and Physical, chart, labs and discussed the procedure including the risks, benefits and alternatives for the proposed anesthesia with the patient or authorized representative who has indicated his/her understanding and acceptance.       Plan Discussed with: Anesthesiologist  Anesthesia Plan Comments:         Anesthesia Quick Evaluation  

## 2017-07-19 NOTE — Anesthesia Procedure Notes (Signed)
Epidural Patient location during procedure: OB Start time: 07/19/2017 12:55 PM End time: 07/19/2017 1:05 PM  Staffing Anesthesiologist: Achille Rich, MD Performed: anesthesiologist   Preanesthetic Checklist Completed: patient identified, site marked, pre-op evaluation, timeout performed, IV checked, risks and benefits discussed and monitors and equipment checked  Epidural Patient position: sitting Prep: DuraPrep Patient monitoring: heart rate, cardiac monitor, continuous pulse ox and blood pressure Approach: midline Location: L2-L3 Injection technique: LOR saline  Needle:  Needle type: Tuohy  Needle gauge: 17 G Needle length: 9 cm Needle insertion depth: 7 cm Catheter type: closed end flexible Catheter size: 19 Gauge Catheter at skin depth: 12 cm Test dose: negative and Other  Assessment Events: blood not aspirated, injection not painful, no injection resistance and negative IV test  Additional Notes Informed consent obtained prior to proceeding including risk of failure, 1% risk of PDPH, risk of minor discomfort and bruising.  Discussed rare but serious complications including epidural abscess, permanent nerve injury, epidural hematoma.  Discussed alternatives to epidural analgesia and patient desires to proceed.  Timeout performed pre-procedure verifying patient name, procedure, and platelet count.  Patient tolerated procedure well. Reason for block:procedure for pain

## 2017-07-20 LAB — CBC
HEMATOCRIT: 35.7 % — AB (ref 36.0–46.0)
Hemoglobin: 11.5 g/dL — ABNORMAL LOW (ref 12.0–15.0)
MCH: 28.3 pg (ref 26.0–34.0)
MCHC: 32.2 g/dL (ref 30.0–36.0)
MCV: 87.9 fL (ref 78.0–100.0)
PLATELETS: 235 10*3/uL (ref 150–400)
RBC: 4.06 MIL/uL (ref 3.87–5.11)
RDW: 13.7 % (ref 11.5–15.5)
WBC: 12.2 10*3/uL — AB (ref 4.0–10.5)

## 2017-07-20 NOTE — Anesthesia Postprocedure Evaluation (Signed)
Anesthesia Post Note  Patient: Grace Mcdaniel  Procedure(s) Performed: AN AD HOC LABOR EPIDURAL     Patient location during evaluation: Mother Baby Anesthesia Type: Epidural Level of consciousness: awake and alert Pain management: pain level controlled Vital Signs Assessment: post-procedure vital signs reviewed and stable Respiratory status: spontaneous breathing, nonlabored ventilation and respiratory function stable Cardiovascular status: stable Postop Assessment: no headache, no backache and epidural receding Anesthetic complications: no    Last Vitals:  Vitals:   07/19/17 2300 07/20/17 0606  BP: 119/67 129/70  Pulse: 73 80  Resp: 16 16  Temp: 37 C 36.7 C  SpO2:      Last Pain:  Vitals:   07/20/17 0600  TempSrc:   PainSc: 1                  [name]

## 2017-07-20 NOTE — Lactation Note (Signed)
This note was copied from a baby's chart. Lactation Consultation Note  Patient Name: Grace Mcdaniel [id number] Date: 07/20/2017 Reason for consult: Initial assessment;Term  P3 mother whose infant is now 69 hours old.  Mother breastfed her other 2 children for a year each.  She hopes to breastfeed this baby for the same length of time.  Infant swaddled and in mother's arms.  She is not showing any feeding cues.  Reviewed feeding cues with mother.  She has no questions/concerns at the present time   She states that baby is very sleepy and I reassured her that this is normal for her  age of life.  Continue to do STS and to watch for cues.    Encouraged feeding 8-12 times/24 hours or earlier if she shows cues.  Reviewed breast massage and hand expression.  Mother has a spoon at bedside for any EBM that she may obtain.  Mom made aware of O/P services, breastfeeding support groups, community resources, and our phone # for post-discharge questions. Mother will call for assistance as needed.  Father present and supportive.    Maternal Data Formula Feeding for Exclusion: No Has patient been taught Hand Expression?: Yes Does the patient have breastfeeding experience prior to this delivery?: Yes                Lactation Tools Discussed/Used WIC Program: No   Consult Status Consult Status: Follow-up Date: 07/21/17 Follow-up type: In-patient    Tipton Ballow R Jarmaine Ehrler 07/20/2017, 5:33 AM

## 2017-07-20 NOTE — Progress Notes (Signed)
Patient doing well. No complaints.  BP 129/70 (BP Location: Left Arm)   Pulse 80   Temp 98 F (36.7 C)   Resp 16   Ht  (1.727 m)   Wt 97.5 kg (215 lb)   LMP 10/17/2016   SpO2 100%   Breastfeeding? Unknown   BMI 32.69 kg/m  Results for orders placed or performed during the hospital encounter of 07/19/17 (from the past 24 hour(s))  CBC     Status: Abnormal   Collection Time: 07/20/17  5:28 AM  Result Value Ref Range   WBC 12.2 (H) 4.0 - 10.5 K/uL   RBC 4.06 3.87 - 5.11 MIL/uL   Hemoglobin 11.5 (L) 12.0 - 15.0 g/dL   HCT 16.1 (L) 09.6 - 04.5 %   MCV 87.9 78.0 - 100.0 fL   MCH 28.3 26.0 - 34.0 pg   MCHC 32.2 30.0 - 36.0 g/dL   RDW 40.9 81.1 - 91.4 %   Platelets 235 150 - 400 K/uL   Abdomen is soft and non tender Lochia WNL  PPD # 1  Doing well Routine care Discharge home tomorrow

## 2017-07-20 NOTE — Anesthesia Postprocedure Evaluation (Signed)
Anesthesia Post Note  Patient: Grace Mcdaniel  Procedure(s) Performed: AN AD HOC LABOR EPIDURAL     Patient location during evaluation: Mother Baby Anesthesia Type: Epidural Level of consciousness: awake and alert Pain management: pain level controlled Vital Signs Assessment: post-procedure vital signs reviewed and stable Respiratory status: spontaneous breathing, nonlabored ventilation and respiratory function stable Cardiovascular status: stable Postop Assessment: no headache, no backache, epidural receding, able to ambulate, adequate PO intake, no apparent nausea or vomiting and patient able to bend at knees Anesthetic complications: no    Last Vitals:  Vitals:   07/19/17 2300 07/20/17 0606  BP: 119/67 129/70  Pulse: 73 80  Resp: 16 16  Temp: 37 C 36.7 C  SpO2:      Last Pain:  Vitals:   07/20/17 0600  TempSrc:   PainSc: 1    Pain Goal:                 [name]

## 2017-07-20 NOTE — Addendum Note (Signed)
Addendum  created 07/20/17 1453 by Elgie Congo, CRNA   Charge Capture section accepted, Sign clinical note

## 2017-07-21 NOTE — Discharge Summary (Signed)
Obstetric Discharge Summary Reason for Admission: induction of labor Prenatal Procedures: none Intrapartum Procedures: spontaneous vaginal delivery Postpartum Procedures: none Complications-Operative and Postpartum: 1st degree perineal laceration Hemoglobin  Date Value Ref Range Status  07/20/2017 11.5 (L) 12.0 - 15.0 g/dL Final   HCT  Date Value Ref Range Status  07/20/2017 35.7 (L) 36.0 - 46.0 % Final    Physical Exam:  General: alert, cooperative, appears stated age and no distress Lochia: appropriate Uterine Fundus: firm Incision: healing well DVT Evaluation: No evidence of DVT seen on physical exam.  Discharge Diagnoses: Term Pregnancy-delivered  Discharge Information: Date: 07/21/2017 Activity: pelvic rest Diet: routine Medications: None Condition: stable Instructions: refer to practice specific booklet Discharge to: home   Newborn Data: Live born female  Birth Weight: 9 lb 5 oz (4225 g) APGAR: 9, 9  Newborn Delivery   Birth date/time:  07/19/2017 16:21:00 Delivery type:  Vaginal, Spontaneous     Home with mother.  Grace Mcdaniel 07/21/2017, 9:19 AM

## 2019-01-23 ENCOUNTER — Other Ambulatory Visit: Payer: Self-pay | Admitting: Obstetrics and Gynecology

## 2019-01-23 DIAGNOSIS — R928 Other abnormal and inconclusive findings on diagnostic imaging of breast: Secondary | ICD-10-CM

## 2019-01-24 ENCOUNTER — Other Ambulatory Visit: Payer: Self-pay

## 2019-01-24 ENCOUNTER — Ambulatory Visit
Admission: RE | Admit: 2019-01-24 | Discharge: 2019-01-24 | Disposition: A | Discharge status: Home / Self Care | Payer: BC Managed Care – PPO | Source: Ambulatory Visit | Attending: Obstetrics and Gynecology | Admitting: Obstetrics and Gynecology

## 2019-01-24 ENCOUNTER — Ambulatory Visit: Payer: BC Managed Care – PPO

## 2019-01-24 DIAGNOSIS — R928 Other abnormal and inconclusive findings on diagnostic imaging of breast: Secondary | ICD-10-CM

## 2019-01-29 ENCOUNTER — Ambulatory Visit
Admission: RE | Admit: 2019-01-29 | Discharge: 2019-01-29 | Disposition: A | Discharge status: Home / Self Care | Payer: BC Managed Care – PPO | Source: Ambulatory Visit | Attending: Obstetrics and Gynecology | Admitting: Obstetrics and Gynecology

## 2019-01-29 ENCOUNTER — Other Ambulatory Visit: Payer: Self-pay

## 2019-01-29 DIAGNOSIS — R928 Other abnormal and inconclusive findings on diagnostic imaging of breast: Secondary | ICD-10-CM

## 2019-02-01 ENCOUNTER — Other Ambulatory Visit: Payer: Self-pay | Admitting: Obstetrics and Gynecology

## 2019-02-01 DIAGNOSIS — Z803 Family history of malignant neoplasm of breast: Secondary | ICD-10-CM

## 2019-02-11 ENCOUNTER — Ambulatory Visit
Admission: RE | Admit: 2019-02-11 | Discharge: 2019-02-11 | Disposition: A | Discharge status: Home / Self Care | Payer: BC Managed Care – PPO | Source: Ambulatory Visit | Attending: Obstetrics and Gynecology | Admitting: Obstetrics and Gynecology

## 2019-02-11 DIAGNOSIS — Z803 Family history of malignant neoplasm of breast: Secondary | ICD-10-CM

## 2019-02-11 MED ORDER — GADOBUTROL 1 MMOL/ML IV SOLN
10.0000 mL | Freq: Once | INTRAVENOUS | Status: AC | PRN
  Administered 2019-02-11: 10 mL via INTRAVENOUS

## 2019-02-12 ENCOUNTER — Other Ambulatory Visit: Payer: Self-pay | Admitting: Obstetrics and Gynecology

## 2019-02-12 DIAGNOSIS — R9389 Abnormal findings on diagnostic imaging of other specified body structures: Secondary | ICD-10-CM

## 2019-03-01 ENCOUNTER — Ambulatory Visit: Payer: BC Managed Care – PPO

## 2019-03-01 ENCOUNTER — Other Ambulatory Visit: Payer: Self-pay | Admitting: Obstetrics and Gynecology

## 2019-03-01 ENCOUNTER — Other Ambulatory Visit: Payer: Self-pay

## 2019-03-01 ENCOUNTER — Ambulatory Visit
Admission: RE | Admit: 2019-03-01 | Discharge: 2019-03-01 | Disposition: A | Discharge status: Home / Self Care | Payer: BC Managed Care – PPO | Source: Ambulatory Visit | Attending: Obstetrics and Gynecology | Admitting: Obstetrics and Gynecology

## 2019-03-01 DIAGNOSIS — R9389 Abnormal findings on diagnostic imaging of other specified body structures: Secondary | ICD-10-CM

## 2019-03-19 ENCOUNTER — Ambulatory Visit
Admission: RE | Admit: 2019-03-19 | Discharge: 2019-03-19 | Disposition: A | Discharge status: Home / Self Care | Payer: BC Managed Care – PPO | Source: Ambulatory Visit | Attending: Obstetrics and Gynecology | Admitting: Obstetrics and Gynecology

## 2019-03-19 ENCOUNTER — Other Ambulatory Visit: Payer: Self-pay | Admitting: Radiology

## 2019-03-19 ENCOUNTER — Other Ambulatory Visit: Payer: Self-pay

## 2019-03-19 DIAGNOSIS — R9389 Abnormal findings on diagnostic imaging of other specified body structures: Secondary | ICD-10-CM

## 2019-03-19 MED ORDER — GADOBUTROL 1 MMOL/ML IV SOLN
10.0000 mL | Freq: Once | INTRAVENOUS | Status: AC | PRN
  Administered 2019-03-19: 10 mL via INTRAVENOUS

## 2019-08-14 ENCOUNTER — Other Ambulatory Visit: Payer: Self-pay | Admitting: Obstetrics and Gynecology

## 2019-08-14 DIAGNOSIS — N6320 Unspecified lump in the left breast, unspecified quadrant: Secondary | ICD-10-CM

## 2019-09-11 ENCOUNTER — Other Ambulatory Visit: Payer: BC Managed Care – PPO

## 2020-01-31 ENCOUNTER — Other Ambulatory Visit: Payer: Self-pay | Admitting: Obstetrics and Gynecology

## 2020-01-31 DIAGNOSIS — Z9189 Other specified personal risk factors, not elsewhere classified: Secondary | ICD-10-CM

## 2020-07-04 ENCOUNTER — Ambulatory Visit
Admission: RE | Admit: 2020-07-04 | Discharge: 2020-07-04 | Disposition: A | Discharge status: Home / Self Care | Payer: BC Managed Care – PPO | Source: Ambulatory Visit | Attending: Obstetrics and Gynecology | Admitting: Obstetrics and Gynecology

## 2020-07-04 ENCOUNTER — Other Ambulatory Visit: Payer: Self-pay

## 2020-07-04 DIAGNOSIS — Z9189 Other specified personal risk factors, not elsewhere classified: Secondary | ICD-10-CM

## 2020-07-04 MED ORDER — GADOBUTROL 1 MMOL/ML IV SOLN
10.0000 mL | Freq: Once | INTRAVENOUS | Status: AC | PRN
  Administered 2020-07-04: 10 mL via INTRAVENOUS

## 2020-07-06 ENCOUNTER — Other Ambulatory Visit: Payer: Self-pay | Admitting: Obstetrics and Gynecology

## 2020-07-06 DIAGNOSIS — R9389 Abnormal findings on diagnostic imaging of other specified body structures: Secondary | ICD-10-CM

## 2020-07-07 ENCOUNTER — Other Ambulatory Visit: Payer: Self-pay | Admitting: Obstetrics and Gynecology

## 2020-07-24 ENCOUNTER — Ambulatory Visit
Admission: RE | Admit: 2020-07-24 | Discharge: 2020-07-24 | Disposition: A | Discharge status: Home / Self Care | Payer: BC Managed Care – PPO | Source: Ambulatory Visit | Attending: Obstetrics and Gynecology | Admitting: Obstetrics and Gynecology

## 2020-07-24 ENCOUNTER — Other Ambulatory Visit: Payer: Self-pay

## 2020-07-24 ENCOUNTER — Other Ambulatory Visit (HOSPITAL_COMMUNITY): Payer: Self-pay | Admitting: Diagnostic Radiology

## 2020-07-24 DIAGNOSIS — R9389 Abnormal findings on diagnostic imaging of other specified body structures: Secondary | ICD-10-CM

## 2020-07-24 MED ORDER — GADOBUTROL 1 MMOL/ML IV SOLN
10.0000 mL | Freq: Once | INTRAVENOUS | Status: AC | PRN
  Administered 2020-07-24: 10 mL via INTRAVENOUS

## 2021-01-25 ENCOUNTER — Other Ambulatory Visit: Payer: Self-pay | Admitting: Obstetrics and Gynecology

## 2021-01-25 DIAGNOSIS — R928 Other abnormal and inconclusive findings on diagnostic imaging of breast: Secondary | ICD-10-CM

## 2021-02-12 IMAGING — US US BREAST*L* LIMITED INC AXILLA
1 series · 5 of 5 positions shown · non-contrast
Comparison: Previous exam(s).

CLINICAL DATA: Patient recalled from screening for possible left
breast distortion

EXAM:
DIGITAL DIAGNOSTIC LEFT MAMMOGRAM WITH CAD AND TOMO
ULTRASOUND LEFT BREAST

[Series 1: us breast*left* limited inc axilla · 0.07mm/px · 5 of 5 slices shown]
[im 1/5]
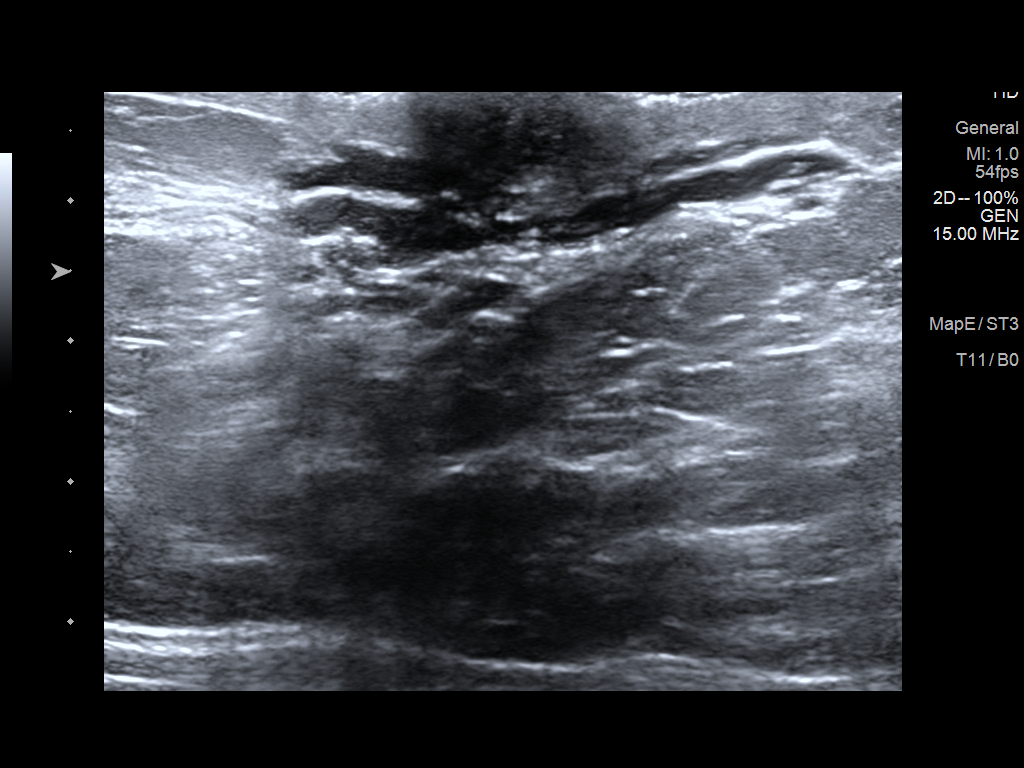
[im 2/5]
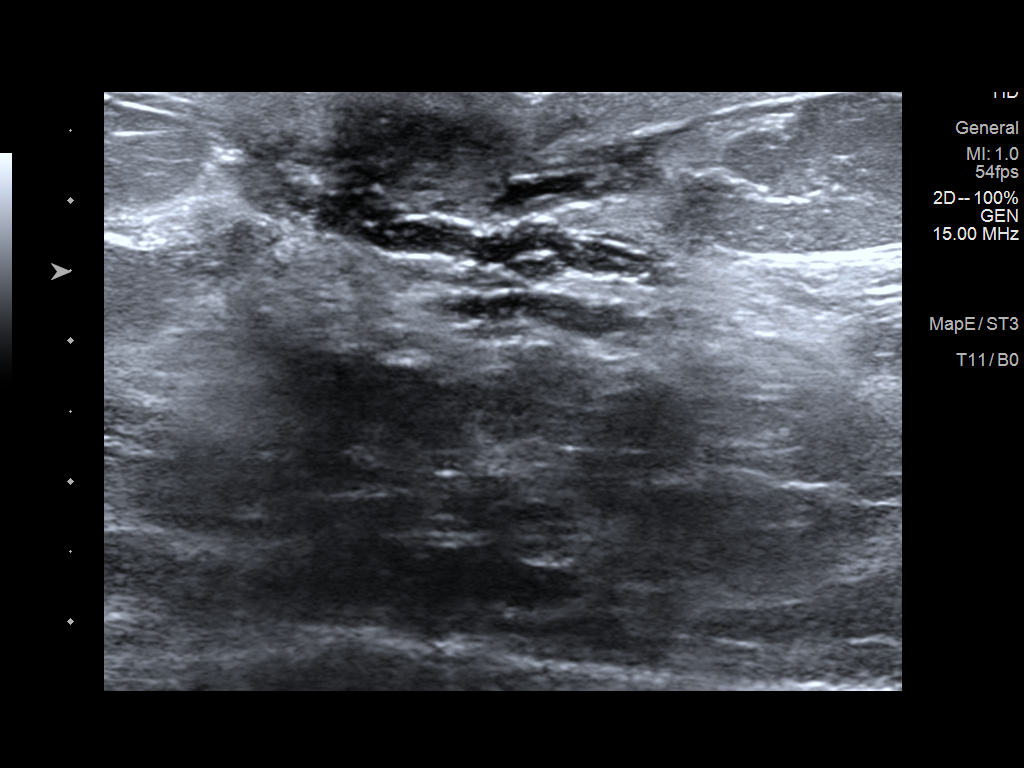
[im 3/5]
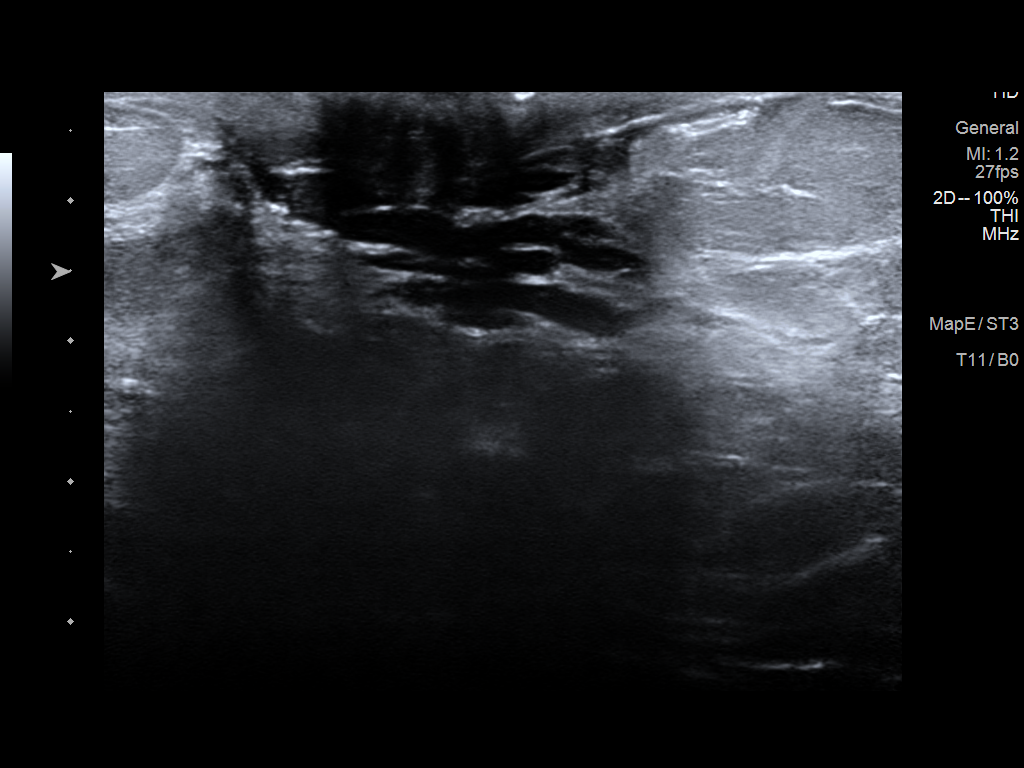
[im 4/5]
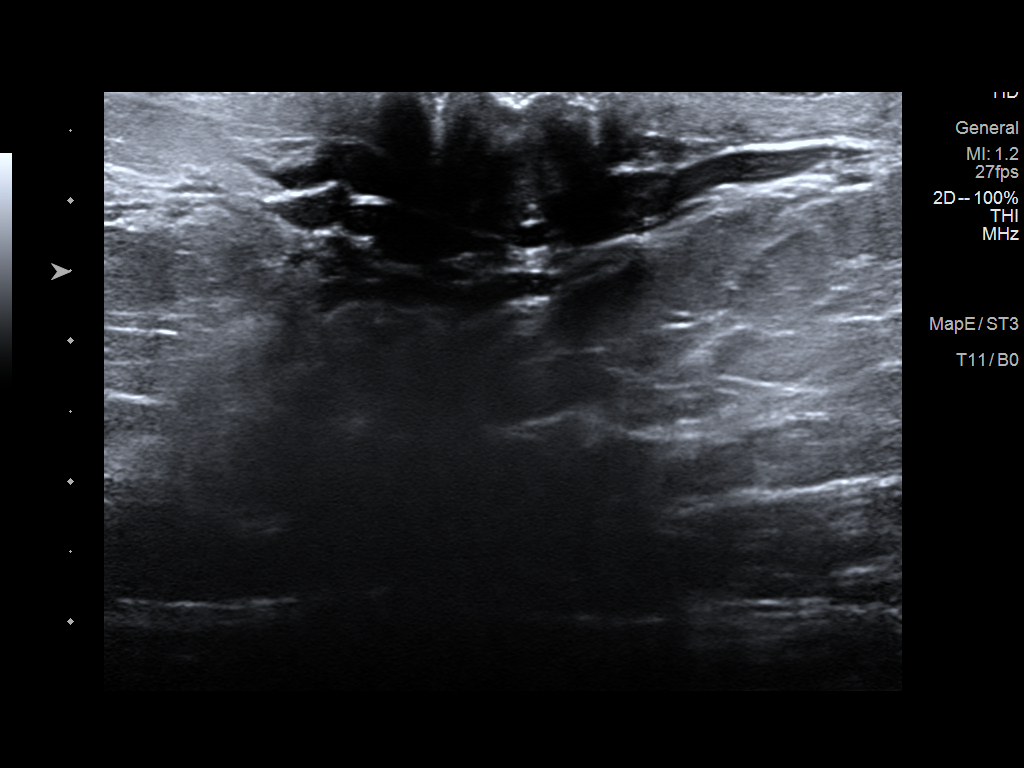
[im 5/5]
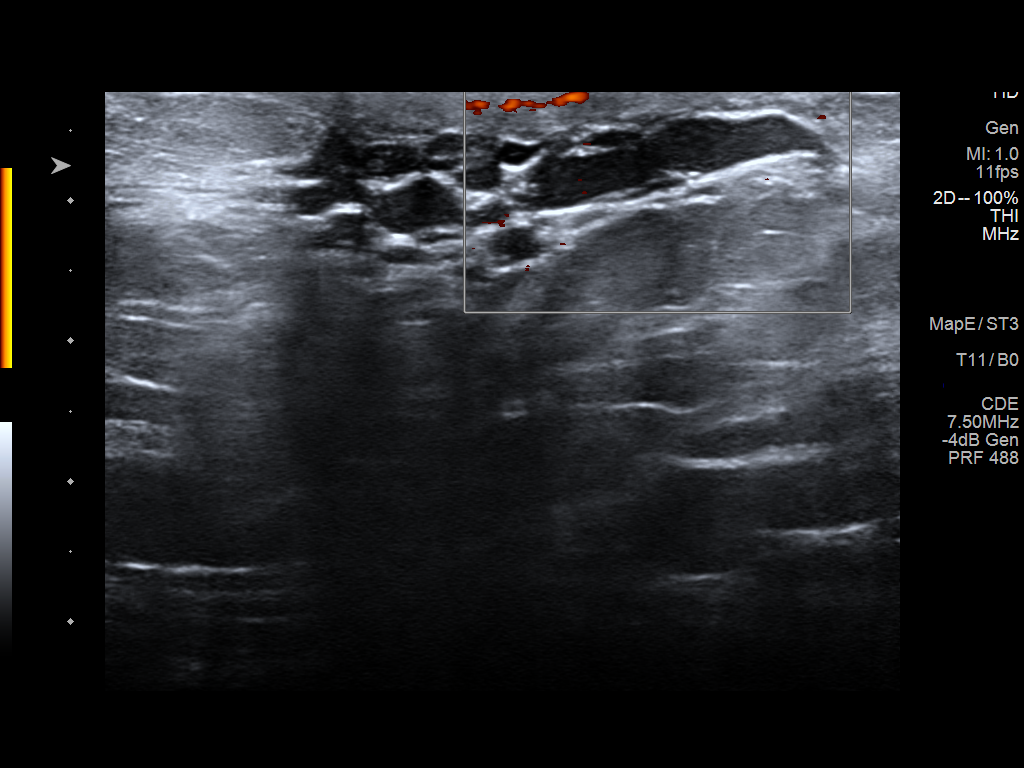

[5 of 5 positions shown; findings below may reference images not displayed]

ACR Breast Density Category c: The breast tissue is heterogeneously
dense, which may obscure small masses.
FINDINGS: Questioned distortion within the retroareolar left breast resolved
with additional imaging compatible with overlapping fibroglandular
tissue. No suspicious abnormalities on additional imaging.

Mammographic images were processed with CAD.

On physical exam, no discrete mass palpated within the retroareolar
left breast.

Targeted ultrasound is performed, showing multiple dilated ducts
without intraductal mass within the left breast retroareolar
location.
IMPRESSION: No mammographic evidence for malignancy.

RECOMMENDATION:
Continue with annual screening mammography given high risk status.

Recommend high risk bilateral breast MRI given family history of
breast carcinoma. This can be performed on an annual basis.

I have discussed the findings and recommendations with the patient.
If applicable, a reminder letter will be sent to the patient
regarding the next appointment.

BI-RADS CATEGORY  2: Benign.

## 2021-03-01 ENCOUNTER — Ambulatory Visit
Admission: RE | Admit: 2021-03-01 | Discharge: 2021-03-01 | Disposition: A | Discharge status: Home / Self Care | Payer: BC Managed Care – PPO | Source: Ambulatory Visit | Attending: Obstetrics and Gynecology | Admitting: Obstetrics and Gynecology

## 2021-03-01 ENCOUNTER — Other Ambulatory Visit: Payer: Self-pay | Admitting: Obstetrics and Gynecology

## 2021-03-01 ENCOUNTER — Ambulatory Visit: Payer: BC Managed Care – PPO

## 2021-03-01 DIAGNOSIS — R928 Other abnormal and inconclusive findings on diagnostic imaging of breast: Secondary | ICD-10-CM

## 2022-02-18 ENCOUNTER — Other Ambulatory Visit: Payer: Self-pay | Admitting: Obstetrics and Gynecology

## 2022-02-18 DIAGNOSIS — Z1239 Encounter for other screening for malignant neoplasm of breast: Secondary | ICD-10-CM

## 2022-08-20 ENCOUNTER — Ambulatory Visit
Admission: RE | Admit: 2022-08-20 | Discharge: 2022-08-20 | Disposition: A | Discharge status: Home / Self Care | Payer: BC Managed Care – PPO | Source: Ambulatory Visit | Attending: Obstetrics and Gynecology | Admitting: Obstetrics and Gynecology

## 2022-08-20 DIAGNOSIS — Z1239 Encounter for other screening for malignant neoplasm of breast: Secondary | ICD-10-CM

## 2022-08-20 MED ORDER — GADOPICLENOL 0.5 MMOL/ML IV SOLN
10.0000 mL | Freq: Once | INTRAVENOUS | Status: AC | PRN
  Administered 2022-08-20: 10 mL via INTRAVENOUS

## 2023-11-24 ENCOUNTER — Ambulatory Visit: Admitting: Podiatry

## 2023-11-24 DIAGNOSIS — B351 Tinea unguium: Secondary | ICD-10-CM | POA: Diagnosis not present

## 2023-11-24 NOTE — Progress Notes (Signed)
      Subjective:  Patient ID: Grace Mcdaniel, female    DOB: Nov 02, 1985,  MRN: [id number]  Rolland JONETTA Makua presents to clinic today for concern fungal nails to the right great toe and the left third toenail.  States that the right great toenail began getting discolored and thick approximately 6 years ago following a pedicure.  States the nail became black and then has been lifted and yellow ever since.  The left third toenail shows more recent involvement.  Denies injury.  Denies loss of toenail.  Past Medical History:  Diagnosis Date   H/O varicella    Heat stroke    Past Surgical History:  Procedure Laterality Date   WISDOM TOOTH EXTRACTION     No Known Allergies  Review of Systems: Negative except as noted in the HPI.  Objective:  Vascular Examination: Capillary refill time is 3-5 seconds to toes bilateral. Palpable pedal pulses b/l LE. Digital hair present b/l.    Dermatological Examination: Pedal skin with normal turgor, texture and tone b/l. No open wounds. No interdigital macerations b/l.  The right hallux and left third toenails are 3mm thick, discolored, dystrophic with subungual debris. There is pain with compression of the nail plates.    Assessment/Plan: 1. Fungal nail infection     Clippings of the affected toenails were obtained and sent to [facility] laboratory for fungal nail culture.  Informed patient it may take up to 3 weeks to receive the final report.  Will contact the patient to review the results.  We will discuss treatment plan at that time and follow-up.  Discussed topical, oral, and laser nail treatment options with the patient today.  Discussed risks involved as well.   Awanda CHARM Imperial, DPM, FACFAS [facility]     [address] [place], [place] [address]                Office [phone]  Fax [phone]

## 2023-12-11 ENCOUNTER — Ambulatory Visit: Payer: Self-pay | Admitting: Podiatry

## 2023-12-11 DIAGNOSIS — B351 Tinea unguium: Secondary | ICD-10-CM

## 2023-12-29 ENCOUNTER — Telehealth: Payer: Self-pay | Admitting: Lab

## 2023-12-29 NOTE — Telephone Encounter (Signed)
 Patient is calling about nail clipping result would like a call.

## 2024-01-01 ENCOUNTER — Encounter: Payer: Self-pay | Admitting: Podiatry

## 2024-01-01 NOTE — Telephone Encounter (Signed)
No answer left detailed message.

## 2024-01-02 LAB — HEPATIC FUNCTION PANEL
ALT: 14 IU/L (ref 0–32)
AST: 14 IU/L (ref 0–40)
Albumin: 4.4 g/dL (ref 3.9–4.9)
Alkaline Phosphatase: 87 IU/L (ref 41–116)
Bilirubin Total: <0.2 mg/dL (ref 0.0–1.2)
Bilirubin, Direct: <0.08 mg/dL (ref 0.00–0.40)
Total Protein: 7 g/dL (ref 6.0–8.5)

## 2024-01-03 MED ORDER — TERBINAFINE HCL 250 MG PO TABS: 250.0000 mg | ORAL_TABLET | Freq: Every day | ORAL | 0 refills | Status: AC

## 2024-01-03 MED ORDER — CICLOPIROX 8 % EX SOLN: Freq: Every day | CUTANEOUS | 11 refills | Status: AC

## 2024-01-04 ENCOUNTER — Telehealth: Payer: Self-pay

## 2024-01-04 NOTE — Telephone Encounter (Signed)
PA request was denied.

## 2024-01-04 NOTE — Telephone Encounter (Signed)
 PA request received from [employer] for Ciclopirox 8% solution. PA submitted and waiting on response.  Malynda Klahr  (KeyBETHA MESSIER) PA Case ID #: [id number] Rx #: [id number]
